# Patient Record
Sex: Female | Born: 1990 | Race: Black or African American | Hispanic: No | Marital: Single | Smoking: Former smoker
Health system: Southern US, Community
[De-identification: ages and names within clinical notes are randomized; demographics above are authoritative.]

## PROBLEM LIST (undated history)

## (undated) DIAGNOSIS — E042 Nontoxic multinodular goiter: Secondary | ICD-10-CM

## (undated) HISTORY — DX: Nontoxic multinodular goiter: E04.2

## (undated) HISTORY — PX: NO PAST SURGERIES: SHX2092

---

## 2016-01-24 ENCOUNTER — Encounter (HOSPITAL_COMMUNITY): Payer: Self-pay | Admitting: Emergency Medicine

## 2016-01-24 ENCOUNTER — Emergency Department (HOSPITAL_COMMUNITY)
Admission: EM | Admit: 2016-01-24 | Discharge: 2016-01-24 | Disposition: A | Discharge status: Home / Self Care | Payer: Medicaid Other | Attending: Physician Assistant | Admitting: Physician Assistant

## 2016-01-24 DIAGNOSIS — Z5321 Procedure and treatment not carried out due to patient leaving prior to being seen by health care provider: Secondary | ICD-10-CM | POA: Diagnosis not present

## 2016-01-24 DIAGNOSIS — M549 Dorsalgia, unspecified: Secondary | ICD-10-CM | POA: Diagnosis not present

## 2016-01-24 DIAGNOSIS — F17200 Nicotine dependence, unspecified, uncomplicated: Secondary | ICD-10-CM | POA: Insufficient documentation

## 2016-01-24 LAB — URINALYSIS, ROUTINE W REFLEX MICROSCOPIC
Bilirubin Urine: NEGATIVE
GLUCOSE, UA: NEGATIVE mg/dL
HGB URINE DIPSTICK: NEGATIVE
KETONES UR: NEGATIVE mg/dL
Leukocytes, UA: NEGATIVE
Nitrite: NEGATIVE
PROTEIN: NEGATIVE mg/dL
Specific Gravity, Urine: 1.021 (ref 1.005–1.030)
pH: 6.5 (ref 5.0–8.0)

## 2016-01-24 LAB — POC URINE PREG, ED: PREG TEST UR: NEGATIVE

## 2016-01-24 NOTE — ED Triage Notes (Signed)
Pt. reports upper/mid back pain onset today , denies injury / no urinary discomfort .

## 2016-01-24 NOTE — ED Notes (Signed)
Pt st's she has to leave to go pick up her child.

## 2019-03-08 ENCOUNTER — Ambulatory Visit (INDEPENDENT_AMBULATORY_CARE_PROVIDER_SITE_OTHER): Payer: Medicaid Other

## 2019-03-08 ENCOUNTER — Other Ambulatory Visit: Payer: Self-pay

## 2019-03-08 DIAGNOSIS — O0990 Supervision of high risk pregnancy, unspecified, unspecified trimester: Secondary | ICD-10-CM

## 2019-03-08 DIAGNOSIS — Z8759 Personal history of other complications of pregnancy, childbirth and the puerperium: Secondary | ICD-10-CM

## 2019-03-08 DIAGNOSIS — Z3480 Encounter for supervision of other normal pregnancy, unspecified trimester: Secondary | ICD-10-CM

## 2019-03-08 MED ORDER — BLOOD PRESSURE KIT DEVI: 1.0000 | 0 refills | Status: AC | PRN

## 2019-03-08 NOTE — Progress Notes (Addendum)
  Virtual Visit via Telephone Note  I connected with Nicole Day on 03/08/19 at  8:15 AM EDT by telephone and verified that I am speaking with the correct person using two identifiers.  Location: Patient: Nicole Day Provider: Hollice Gong, CMA    I discussed the limitations, risks, security and privacy concerns of performing an evaluation and management service by telephone and the availability of in person appointments. I also discussed with the patient that there may be a patient responsible charge related to this service. The patient expressed understanding and agreed to proceed.   History of Present Illness: PRENATAL INTAKE SUMMARY  Ms. Gundy presents today New OB Nurse Interview.  OB History    Gravida  2   Para  1   Term  1   Preterm      AB      Living  1     SAB      TAB      Ectopic      Multiple      Live Births  1          I have reviewed the patient's medical, obstetrical, social, and family histories, medications, and available lab results.  SUBJECTIVE: She has no unusual complaints   Observations/Objective: Initial nurse interview for history/labs (New OB)  EDD: 10/05/2019 GA: [redacted]w[redacted]d G2P1 FHT: not assessed, non face to face interview   GENERAL APPEARANCE: alert, oriented to person, place and time, non face to face interview   Assessment and Plan: High Risk pregnancy - Hx of IOL @37  weeks IUGR Prenatal care at [facility] labs and exam scheduled with provider Download Babyscripts Download Mychart  Follow Up Instructions:   I discussed the assessment and treatment plan with the patient. The patient was provided an opportunity to ask questions and all were answered. The patient agreed with the plan and demonstrated an understanding of the instructions.   The patient was advised to call back or seek an in-person evaluation if the symptoms worsen or if the condition fails to improve as anticipated.  I provided 20 minutes of  non-face-to-face time during this encounter.   Maryruth Eve, CMA   Attestation of Attending Supervision of CMA/RN: Evaluation and management procedures were performed by the nurse under my supervision and collaboration.  I have reviewed the nursing note and chart, and I agree with the management and plan.  Carolyn L. Harraway-Smith, M.D., Cherlynn June

## 2019-03-17 ENCOUNTER — Encounter: Payer: Self-pay | Admitting: Obstetrics and Gynecology

## 2019-03-17 ENCOUNTER — Other Ambulatory Visit (HOSPITAL_COMMUNITY)
Admission: RE | Admit: 2019-03-17 | Discharge: 2019-03-17 | Disposition: A | Discharge status: Home / Self Care | Payer: Medicaid Other | Source: Ambulatory Visit | Attending: Obstetrics and Gynecology | Admitting: Obstetrics and Gynecology

## 2019-03-17 ENCOUNTER — Other Ambulatory Visit: Payer: Self-pay

## 2019-03-17 ENCOUNTER — Ambulatory Visit (INDEPENDENT_AMBULATORY_CARE_PROVIDER_SITE_OTHER): Payer: Medicaid Other | Admitting: Obstetrics and Gynecology

## 2019-03-17 DIAGNOSIS — O0990 Supervision of high risk pregnancy, unspecified, unspecified trimester: Secondary | ICD-10-CM | POA: Diagnosis present

## 2019-03-17 DIAGNOSIS — Z8759 Personal history of other complications of pregnancy, childbirth and the puerperium: Secondary | ICD-10-CM

## 2019-03-17 DIAGNOSIS — Z3A11 11 weeks gestation of pregnancy: Secondary | ICD-10-CM

## 2019-03-17 DIAGNOSIS — O09291 Supervision of pregnancy with other poor reproductive or obstetric history, first trimester: Secondary | ICD-10-CM

## 2019-03-17 DIAGNOSIS — O0991 Supervision of high risk pregnancy, unspecified, first trimester: Secondary | ICD-10-CM | POA: Diagnosis not present

## 2019-03-17 NOTE — Progress Notes (Signed)
Pt presents for NOB exam. NOB intake completed virtually. Flu vaccine offered; pt declined.

## 2019-03-17 NOTE — Patient Instructions (Signed)
° °First Trimester of Pregnancy °The first trimester of pregnancy is from week 1 until the end of week 13 (months 1 through 3). A week after a sperm fertilizes an egg, the egg will implant on the wall of the uterus. This embryo will begin to develop into a baby. Genes from you and your partner will form the baby. The female genes will determine whether the baby will be a boy or a girl. At 6-8 weeks, the eyes and face will be formed, and the heartbeat can be seen on ultrasound. At the end of 12 weeks, all the baby's organs will be formed. °Now that you are pregnant, you will want to do everything you can to have a healthy baby. Two of the most important things are to get good prenatal care and to follow your health care provider's instructions. Prenatal care is all the medical care you receive before the baby's birth. This care will help prevent, find, and treat any problems during the pregnancy and childbirth. °Body changes during your first trimester °Your body goes through many changes during pregnancy. The changes vary from woman to woman. °· You may gain or lose a couple of pounds at first. °· You may feel sick to your stomach (nauseous) and you may throw up (vomit). If the vomiting is uncontrollable, call your health care provider. °· You may tire easily. °· You may develop headaches that can be relieved by medicines. All medicines should be approved by your health care provider. °· You may urinate more often. Painful urination may mean you have a bladder infection. °· You may develop heartburn as a result of your pregnancy. °· You may develop constipation because certain hormones are causing the muscles that push stool through your intestines to slow down. °· You may develop hemorrhoids or swollen veins (varicose veins). °· Your breasts may begin to grow larger and become tender. Your nipples may stick out more, and the tissue that surrounds them (areola) may become darker. °· Your gums may bleed and may be  sensitive to brushing and flossing. °· Dark spots or blotches (chloasma, mask of pregnancy) may develop on your face. This will likely fade after the baby is born. °· Your menstrual periods will stop. °· You may have a loss of appetite. °· You may develop cravings for certain kinds of food. °· You may have changes in your emotions from day to day, such as being excited to be pregnant or being concerned that something may go wrong with the pregnancy and baby. °· You may have more vivid and strange dreams. °· You may have changes in your hair. These can include thickening of your hair, rapid growth, and changes in texture. Some women also have hair loss during or after pregnancy, or hair that feels dry or thin. Your hair will most likely return to normal after your baby is born. °What to expect at prenatal visits °During a routine prenatal visit: °· You will be weighed to make sure you and the baby are growing normally. °· Your blood pressure will be taken. °· Your abdomen will be measured to track your baby's growth. °· The fetal heartbeat will be listened to between weeks 10 and 14 of your pregnancy. °· Test results from any previous visits will be discussed. °Your health care provider may ask you: °· How you are feeling. °· If you are feeling the baby move. °· If you have had any abnormal symptoms, such as leaking fluid, bleeding, severe headaches, or   abdominal cramping. °· If you are using any tobacco products, including cigarettes, chewing tobacco, and electronic cigarettes. °· If you have any questions. °Other tests that may be performed during your first trimester include: °· Blood tests to find your blood type and to check for the presence of any previous infections. The tests will also be used to check for low iron levels (anemia) and protein on red blood cells (Rh antibodies). Depending on your risk factors, or if you previously had diabetes during pregnancy, you may have tests to check for high blood sugar  that affects pregnant women (gestational diabetes). °· Urine tests to check for infections, diabetes, or protein in the urine. °· An ultrasound to confirm the proper growth and development of the baby. °· Fetal screens for spinal cord problems (spina bifida) and Down syndrome. °· HIV (human immunodeficiency virus) testing. Routine prenatal testing includes screening for HIV, unless you choose not to have this test. °· You may need other tests to make sure you and the baby are doing well. °Follow these instructions at home: °Medicines °· Follow your health care provider's instructions regarding medicine use. Specific medicines may be either safe or unsafe to take during pregnancy. °· Take a prenatal vitamin that contains at least 600 micrograms (mcg) of folic acid. °· If you develop constipation, try taking a stool softener if your health care provider approves. °Eating and drinking ° °· Eat a balanced diet that includes fresh fruits and vegetables, whole grains, good sources of protein such as meat, eggs, or tofu, and low-fat dairy. Your health care provider will help you determine the amount of weight gain that is right for you. °· Avoid raw meat and uncooked cheese. These carry germs that can cause birth defects in the baby. °· Eating four or five small meals rather than three large meals a day may help relieve nausea and vomiting. If you start to feel nauseous, eating a few soda crackers can be helpful. Drinking liquids between meals, instead of during meals, also seems to help ease nausea and vomiting. °· Limit foods that are high in fat and processed sugars, such as fried and sweet foods. °· To prevent constipation: °? Eat foods that are high in fiber, such as fresh fruits and vegetables, whole grains, and beans. °? Drink enough fluid to keep your urine clear or pale yellow. °Activity °· Exercise only as directed by your health care provider. Most women can continue their usual exercise routine during  pregnancy. Try to exercise for 30 minutes at least 5 days a week. Exercising will help you: °? Control your weight. °? Stay in shape. °? Be prepared for labor and delivery. °· Experiencing pain or cramping in the lower abdomen or lower back is a good sign that you should stop exercising. Check with your health care provider before continuing with normal exercises. °· Try to avoid standing for long periods of time. Move your legs often if you must stand in one place for a long time. °· Avoid heavy lifting. °· Wear low-heeled shoes and practice good posture. °· You may continue to have sex unless your health care provider tells you not to. °Relieving pain and discomfort °· Wear a good support bra to relieve breast tenderness. °· Take warm sitz baths to soothe any pain or discomfort caused by hemorrhoids. Use hemorrhoid cream if your health care provider approves. °· Rest with your legs elevated if you have leg cramps or low back pain. °· If you develop varicose veins   in your legs, wear support hose. Elevate your feet for 15 minutes, 3-4 times a day. Limit salt in your diet. °Prenatal care °· Schedule your prenatal visits by the twelfth week of pregnancy. They are usually scheduled monthly at first, then more often in the last 2 months before delivery. °· Write down your questions. Take them to your prenatal visits. °· Keep all your prenatal visits as told by your health care provider. This is important. °Safety °· Wear your seat belt at all times when driving. °· Make a list of emergency phone numbers, including numbers for family, friends, the hospital, and police and fire departments. °General instructions °· Ask your health care provider for a referral to a local prenatal education class. Begin classes no later than the beginning of month 6 of your pregnancy. °· Ask for help if you have counseling or nutritional needs during pregnancy. Your health care provider can offer advice or refer you to specialists for help  with various needs. °· Do not use hot tubs, steam rooms, or saunas. °· Do not douche or use tampons or scented sanitary pads. °· Do not cross your legs for long periods of time. °· Avoid cat litter boxes and soil used by cats. These carry germs that can cause birth defects in the baby and possibly loss of the fetus by miscarriage or stillbirth. °· Avoid all smoking, herbs, alcohol, and medicines not prescribed by your health care provider. Chemicals in these products affect the formation and growth of the baby. °· Do not use any products that contain nicotine or tobacco, such as cigarettes and e-cigarettes. If you need help quitting, ask your health care provider. You may receive counseling support and other resources to help you quit. °· Schedule a dentist appointment. At home, brush your teeth with a soft toothbrush and be gentle when you floss. °Contact a health care provider if: °· You have dizziness. °· You have mild pelvic cramps, pelvic pressure, or nagging pain in the abdominal area. °· You have persistent nausea, vomiting, or diarrhea. °· You have a bad smelling vaginal discharge. °· You have pain when you urinate. °· You notice increased swelling in your face, hands, legs, or ankles. °· You are exposed to fifth disease or chickenpox. °· You are exposed to German measles (rubella) and have never had it. °Get help right away if: °· You have a fever. °· You are leaking fluid from your vagina. °· You have spotting or bleeding from your vagina. °· You have severe abdominal cramping or pain. °· You have rapid weight gain or loss. °· You vomit blood or material that looks like coffee grounds. °· You develop a severe headache. °· You have shortness of breath. °· You have any kind of trauma, such as from a fall or a car accident. °Summary °· The first trimester of pregnancy is from week 1 until the end of week 13 (months 1 through 3). °· Your body goes through many changes during pregnancy. The changes vary from  woman to woman. °· You will have routine prenatal visits. During those visits, your health care provider will examine you, discuss any test results you may have, and talk with you about how you are feeling. °This information is not intended to replace advice given to you by your health care provider. Make sure you discuss any questions you have with your health care provider. °Document Released: 05/27/2001 Document Revised: 05/15/2017 Document Reviewed: 05/14/2016 °[employer] Patient Education © 2020 [employer] ° ° °  Second Trimester of Pregnancy °The second trimester is from week 14 through week 27 (months 4 through 6). The second trimester is often a time when you feel your best. Your body has adjusted to being pregnant, and you begin to feel better physically. Usually, morning sickness has lessened or quit completely, you may have more energy, and you may have an increase in appetite. The second trimester is also a time when the fetus is growing rapidly. At the end of the sixth month, the fetus is about 9 inches long and weighs about 1½ pounds. You will likely begin to feel the baby move (quickening) between 16 and 20 weeks of pregnancy. °Body changes during your second trimester °Your body continues to go through many changes during your second trimester. The changes vary from woman to woman. °· Your weight will continue to increase. You will notice your lower abdomen bulging out. °· You may begin to get stretch marks on your hips, abdomen, and breasts. °· You may develop headaches that can be relieved by medicines. The medicines should be approved by your health care provider. °· You may urinate more often because the fetus is pressing on your bladder. °· You may develop or continue to have heartburn as a result of your pregnancy. °· You may develop constipation because certain hormones are causing the muscles that push waste through your intestines to slow down. °· You may develop hemorrhoids or swollen,  bulging veins (varicose veins). °· You may have back pain. This is caused by: °? Weight gain. °? Pregnancy hormones that are relaxing the joints in your pelvis. °? A shift in weight and the muscles that support your balance. °· Your breasts will continue to grow and they will continue to become tender. °· Your gums may bleed and may be sensitive to brushing and flossing. °· Dark spots or blotches (chloasma, mask of pregnancy) may develop on your face. This will likely fade after the baby is born. °· A dark line from your belly button to the pubic area (linea nigra) may appear. This will likely fade after the baby is born. °· You may have changes in your hair. These can include thickening of your hair, rapid growth, and changes in texture. Some women also have hair loss during or after pregnancy, or hair that feels dry or thin. Your hair will most likely return to normal after your baby is born. °What to expect at prenatal visits °During a routine prenatal visit: °· You will be weighed to make sure you and the fetus are growing normally. °· Your blood pressure will be taken. °· Your abdomen will be measured to track your baby's growth. °· The fetal heartbeat will be listened to. °· Any test results from the previous visit will be discussed. °Your health care provider may ask you: °· How you are feeling. °· If you are feeling the baby move. °· If you have had any abnormal symptoms, such as leaking fluid, bleeding, severe headaches, or abdominal cramping. °· If you are using any tobacco products, including cigarettes, chewing tobacco, and electronic cigarettes. °· If you have any questions. °Other tests that may be performed during your second trimester include: °· Blood tests that check for: °? Low iron levels (anemia). °? High blood sugar that affects pregnant women (gestational diabetes) between 24 and 28 weeks. °? Rh antibodies. This is to check for a protein on red blood cells (Rh factor). °· Urine tests to check  for infections, diabetes, or protein in   the urine. °· An ultrasound to confirm the proper growth and development of the baby. °· An amniocentesis to check for possible genetic problems. °· Fetal screens for spina bifida and Down syndrome. °· HIV (human immunodeficiency virus) testing. Routine prenatal testing includes screening for HIV, unless you choose not to have this test. °Follow these instructions at home: °Medicines °· Follow your health care provider's instructions regarding medicine use. Specific medicines may be either safe or unsafe to take during pregnancy. °· Take a prenatal vitamin that contains at least 600 micrograms (mcg) of folic acid. °· If you develop constipation, try taking a stool softener if your health care provider approves. °Eating and drinking ° °· Eat a balanced diet that includes fresh fruits and vegetables, whole grains, good sources of protein such as meat, eggs, or tofu, and low-fat dairy. Your health care provider will help you determine the amount of weight gain that is right for you. °· Avoid raw meat and uncooked cheese. These carry germs that can cause birth defects in the baby. °· If you have low calcium intake from food, talk to your health care provider about whether you should take a daily calcium supplement. °· Limit foods that are high in fat and processed sugars, such as fried and sweet foods. °· To prevent constipation: °? Drink enough fluid to keep your urine clear or pale yellow. °? Eat foods that are high in fiber, such as fresh fruits and vegetables, whole grains, and beans. °Activity °· Exercise only as directed by your health care provider. Most women can continue their usual exercise routine during pregnancy. Try to exercise for 30 minutes at least 5 days a week. Stop exercising if you experience uterine contractions. °· Avoid heavy lifting, wear low heel shoes, and practice good posture. °· A sexual relationship may be continued unless your health care provider  directs you otherwise. °Relieving pain and discomfort °· Wear a good support bra to prevent discomfort from breast tenderness. °· Take warm sitz baths to soothe any pain or discomfort caused by hemorrhoids. Use hemorrhoid cream if your health care provider approves. °· Rest with your legs elevated if you have leg cramps or low back pain. °· If you develop varicose veins, wear support hose. Elevate your feet for 15 minutes, 3-4 times a day. Limit salt in your diet. °Prenatal Care °· Write down your questions. Take them to your prenatal visits. °· Keep all your prenatal visits as told by your health care provider. This is important. °Safety °· Wear your seat belt at all times when driving. °· Make a list of emergency phone numbers, including numbers for family, friends, the hospital, and police and fire departments. °General instructions °· Ask your health care provider for a referral to a local prenatal education class. Begin classes no later than the beginning of month 6 of your pregnancy. °· Ask for help if you have counseling or nutritional needs during pregnancy. Your health care provider can offer advice or refer you to specialists for help with various needs. °· Do not use hot tubs, steam rooms, or saunas. °· Do not douche or use tampons or scented sanitary pads. °· Do not cross your legs for long periods of time. °· Avoid cat litter boxes and soil used by cats. These carry germs that can cause birth defects in the baby and possibly loss of the fetus by miscarriage or stillbirth. °· Avoid all smoking, herbs, alcohol, and unprescribed drugs. Chemicals in these products can affect the formation   and growth of the baby. °· Do not use any products that contain nicotine or tobacco, such as cigarettes and e-cigarettes. If you need help quitting, ask your health care provider. °· Visit your dentist if you have not gone yet during your pregnancy. Use a soft toothbrush to brush your teeth and be gentle when you  floss. °Contact a health care provider if: °· You have dizziness. °· You have mild pelvic cramps, pelvic pressure, or nagging pain in the abdominal area. °· You have persistent nausea, vomiting, or diarrhea. °· You have a bad smelling vaginal discharge. °· You have pain when you urinate. °Get help right away if: °· You have a fever. °· You are leaking fluid from your vagina. °· You have spotting or bleeding from your vagina. °· You have severe abdominal cramping or pain. °· You have rapid weight gain or weight loss. °· You have shortness of breath with chest pain. °· You notice sudden or extreme swelling of your face, hands, ankles, feet, or legs. °· You have not felt your baby move in over an hour. °· You have severe headaches that do not go away when you take medicine. °· You have vision changes. °Summary °· The second trimester is from week 14 through week 27 (months 4 through 6). It is also a time when the fetus is growing rapidly. °· Your body goes through many changes during pregnancy. The changes vary from woman to woman. °· Avoid all smoking, herbs, alcohol, and unprescribed drugs. These chemicals affect the formation and growth your baby. °· Do not use any tobacco products, such as cigarettes, chewing tobacco, and e-cigarettes. If you need help quitting, ask your health care provider. °· Contact your health care provider if you have any questions. Keep all prenatal visits as told by your health care provider. This is important. °This information is not intended to replace advice given to you by your health care provider. Make sure you discuss any questions you have with your health care provider. °Document Released: 05/27/2001 Document Revised: 09/24/2018 Document Reviewed: 07/08/2016 °[employer] Patient Education © 2020 [employer] ° ° °Contraception Choices °Contraception, also called birth control, refers to methods or devices that prevent pregnancy. °Hormonal methods °Contraceptive implant ° °A  contraceptive implant is a thin, plastic tube that contains a hormone. It is inserted into the upper part of the arm. It can remain in place for up to 3 years. °Progestin-only injections °Progestin-only injections are injections of progestin, a synthetic form of the hormone progesterone. They are given every 3 months by a health care provider. °Birth control pills ° °Birth control pills are pills that contain hormones that prevent pregnancy. They must be taken once a day, preferably at the same time each day. °Birth control patch ° °The birth control patch contains hormones that prevent pregnancy. It is placed on the skin and must be changed once a week for three weeks and removed on the fourth week. A prescription is needed to use this method of contraception. °Vaginal ring ° °A vaginal ring contains hormones that prevent pregnancy. It is placed in the vagina for three weeks and removed on the fourth week. After that, the process is repeated with a new ring. A prescription is needed to use this method of contraception. °Emergency contraceptive °Emergency contraceptives prevent pregnancy after unprotected sex. They come in pill form and can be taken up to 5 days after sex. They work best the sooner they are taken after having sex. Most emergency contraceptives   are available without a prescription. This method should not be used as your only form of birth control. °Barrier methods °Female condom ° °A female condom is a thin sheath that is worn over the penis during sex. Condoms keep sperm from going inside a woman's body. They can be used with a spermicide to increase their effectiveness. They should be disposed after a single use. °Female condom ° °A female condom is a soft, loose-fitting sheath that is put into the vagina before sex. The condom keeps sperm from going inside a woman's body. They should be disposed after a single use. °Diaphragm ° °A diaphragm is a soft, dome-shaped barrier. It is inserted into the  vagina before sex, along with a spermicide. The diaphragm blocks sperm from entering the uterus, and the spermicide kills sperm. A diaphragm should be left in the vagina for 6-8 hours after sex and removed within 24 hours. °A diaphragm is prescribed and fitted by a health care provider. A diaphragm should be replaced every 1-2 years, after giving birth, after gaining more than 15 lb (6.8 kg), and after pelvic surgery. °Cervical cap ° °A cervical cap is a round, soft latex or plastic cup that fits over the cervix. It is inserted into the vagina before sex, along with spermicide. It blocks sperm from entering the uterus. The cap should be left in place for 6-8 hours after sex and removed within 48 hours. A cervical cap must be prescribed and fitted by a health care provider. It should be replaced every 2 years. °Sponge ° °A sponge is a soft, circular piece of polyurethane foam with spermicide on it. The sponge helps block sperm from entering the uterus, and the spermicide kills sperm. To use it, you make it wet and then insert it into the vagina. It should be inserted before sex, left in for at least 6 hours after sex, and removed and thrown away within 30 hours. °Spermicides °Spermicides are chemicals that kill or block sperm from entering the cervix and uterus. They can come as a cream, jelly, suppository, foam, or tablet. A spermicide should be inserted into the vagina with an applicator at least 10-15 minutes before sex to allow time for it to work. The process must be repeated every time you have sex. Spermicides do not require a prescription. °Intrauterine contraception °Intrauterine device (IUD) °An IUD is a T-shaped device that is put in a woman's uterus. There are two types: °· Hormone IUD.This type contains progestin, a synthetic form of the hormone progesterone. This type can stay in place for 3-5 years. °· Copper IUD.This type is wrapped in copper wire. It can stay in place for 10 years. ° °Permanent  methods of contraception °Female tubal ligation °In this method, a woman's fallopian tubes are sealed, tied, or blocked during surgery to prevent eggs from traveling to the uterus. °Hysteroscopic sterilization °In this method, a small, flexible insert is placed into each fallopian tube. The inserts cause scar tissue to form in the fallopian tubes and block them, so sperm cannot reach an egg. The procedure takes about 3 months to be effective. Another form of birth control must be used during those 3 months. °Female sterilization °This is a procedure to tie off the tubes that carry sperm (vasectomy). After the procedure, the man can still ejaculate fluid (semen). °Natural planning methods °Natural family planning °In this method, a couple does not have sex on days when the woman could become pregnant. °Calendar method °This means keeping track of   the length of each menstrual cycle, identifying the days when pregnancy can happen, and not having sex on those days. °Ovulation method °In this method, a couple avoids sex during ovulation. °Symptothermal method °This method involves not having sex during ovulation. The woman typically checks for ovulation by watching changes in her temperature and in the consistency of cervical mucus. °Post-ovulation method °In this method, a couple waits to have sex until after ovulation. °Summary °· Contraception, also called birth control, means methods or devices that prevent pregnancy. °· Hormonal methods of contraception include implants, injections, pills, patches, vaginal rings, and emergency contraceptives. °· Barrier methods of contraception can include female condoms, female condoms, diaphragms, cervical caps, sponges, and spermicides. °· There are two types of IUDs (intrauterine devices). An IUD can be put in a woman's uterus to prevent pregnancy for 3-5 years. °· Permanent sterilization can be done through a procedure for males, females, or both. °· Natural family planning methods  involve not having sex on days when the woman could become pregnant. °This information is not intended to replace advice given to you by your health care provider. Make sure you discuss any questions you have with your health care provider. °Document Released: 06/02/2005 Document Revised: 06/04/2017 Document Reviewed: 07/05/2016 °Elsevier Patient Education © 2020 [employer] ° ° °Breastfeeding ° °Choosing to breastfeed is one of the best decisions you can make for yourself and your baby. A change in hormones during pregnancy causes your breasts to make breast milk in your milk-producing glands. Hormones prevent breast milk from being released before your baby is born. They also prompt milk flow after birth. Once breastfeeding has begun, thoughts of your baby, as well as his or her sucking or crying, can stimulate the release of milk from your milk-producing glands. °Benefits of breastfeeding °Research shows that breastfeeding offers many health benefits for infants and mothers. It also offers a cost-free and convenient way to feed your baby. °For your baby °· Your first milk (colostrum) helps your baby's digestive system to function better. °· Special cells in your milk (antibodies) help your baby to fight off infections. °· Breastfed babies are less likely to develop asthma, allergies, obesity, or type 2 diabetes. They are also at lower risk for sudden infant death syndrome (SIDS). °· Nutrients in breast milk are better able to meet your baby’s needs compared to infant formula. °· Breast milk improves your baby's brain development. °For you °· Breastfeeding helps to create a very special bond between you and your baby. °· Breastfeeding is convenient. Breast milk costs nothing and is always available at the correct temperature. °· Breastfeeding helps to burn calories. It helps you to lose the weight that you gained during pregnancy. °· Breastfeeding makes your uterus return faster to its size before pregnancy. It  also slows bleeding (lochia) after you give birth. °· Breastfeeding helps to lower your risk of developing type 2 diabetes, osteoporosis, rheumatoid arthritis, cardiovascular disease, and breast, ovarian, uterine, and endometrial cancer later in life. °Breastfeeding basics °Starting breastfeeding °· Find a comfortable place to sit or lie down, with your neck and back well-supported. °· Place a pillow or a rolled-up blanket under your baby to bring him or her to the level of your breast (if you are seated). Nursing pillows are specially designed to help support your arms and your baby while you breastfeed. °· Make sure that your baby's tummy (abdomen) is facing your abdomen. °· Gently massage your breast. With your fingertips, massage from the outer edges   of your breast inward toward the nipple. This encourages milk flow. If your milk flows slowly, you may need to continue this action during the feeding. °· Support your breast with 4 fingers underneath and your thumb above your nipple (make the letter "C" with your hand). Make sure your fingers are well away from your nipple and your baby’s mouth. °· Stroke your baby's lips gently with your finger or nipple. °· When your baby's mouth is open wide enough, quickly bring your baby to your breast, placing your entire nipple and as much of the areola as possible into your baby's mouth. The areola is the colored area around your nipple. °? More areola should be visible above your baby's upper lip than below the lower lip. °? Your baby's lips should be opened and extended outward (flanged) to ensure an adequate, comfortable latch. °? Your baby's tongue should be between his or her lower gum and your breast. °· Make sure that your baby's mouth is correctly positioned around your nipple (latched). Your baby's lips should create a seal on your breast and be turned out (everted). °· It is common for your baby to suck about 2-3 minutes in order to start the flow of breast  milk. °Latching °Teaching your baby how to latch onto your breast properly is very important. An improper latch can cause nipple pain, decreased milk supply, and poor weight gain in your baby. Also, if your baby is not latched onto your nipple properly, he or she may swallow some air during feeding. This can make your baby fussy. Burping your baby when you switch breasts during the feeding can help to get rid of the air. However, teaching your baby to latch on properly is still the best way to prevent fussiness from swallowing air while breastfeeding. °Signs that your baby has successfully latched onto your nipple °· Silent tugging or silent sucking, without causing you pain. Infant's lips should be extended outward (flanged). °· Swallowing heard between every 3-4 sucks once your milk has started to flow (after your let-down milk reflex occurs). °· Muscle movement above and in front of his or her ears while sucking. °Signs that your baby has not successfully latched onto your nipple °· Sucking sounds or smacking sounds from your baby while breastfeeding. °· Nipple pain. °If you think your baby has not latched on correctly, slip your finger into the corner of your baby’s mouth to break the suction and place it between your baby's gums. Attempt to start breastfeeding again. °Signs of successful breastfeeding °Signs from your baby °· Your baby will gradually decrease the number of sucks or will completely stop sucking. °· Your baby will fall asleep. °· Your baby's body will relax. °· Your baby will retain a small amount of milk in his or her mouth. °· Your baby will let go of your breast by himself or herself. °Signs from you °· Breasts that have increased in firmness, weight, and size 1-3 hours after feeding. °· Breasts that are softer immediately after breastfeeding. °· Increased milk volume, as well as a change in milk consistency and color by the fifth day of breastfeeding. °· Nipples that are not sore, cracked, or  bleeding. °Signs that your baby is getting enough milk °· Wetting at least 1-2 diapers during the first 24 hours after birth. °· Wetting at least 5-6 diapers every 24 hours for the first week after birth. The urine should be clear or pale yellow by the age of 5 days. °· Wetting   6-8 diapers every 24 hours as your baby continues to grow and develop. °· At least 3 stools in a 24-hour period by the age of 5 days. The stool should be soft and yellow. °· At least 3 stools in a 24-hour period by the age of 7 days. The stool should be seedy and yellow. °· No loss of weight greater than 10% of birth weight during the first 3 days of life. °· Average weight gain of 4-7 oz (113-198 g) per week after the age of 4 days. °· Consistent daily weight gain by the age of 5 days, without weight loss after the age of 2 weeks. °After a feeding, your baby may spit up a small amount of milk. This is normal. °Breastfeeding frequency and duration °Frequent feeding will help you make more milk and can prevent sore nipples and extremely full breasts (breast engorgement). Breastfeed when you feel the need to reduce the fullness of your breasts or when your baby shows signs of hunger. This is called "breastfeeding on demand." Signs that your baby is hungry include: °· Increased alertness, activity, or restlessness. °· Movement of the head from side to side. °· Opening of the mouth when the corner of the mouth or cheek is stroked (rooting). °· Increased sucking sounds, smacking lips, cooing, sighing, or squeaking. °· Hand-to-mouth movements and sucking on fingers or hands. °· Fussing or crying. °Avoid introducing a pacifier to your baby in the first 4-6 weeks after your baby is born. After this time, you may choose to use a pacifier. Research has shown that pacifier use during the first year of a baby's life decreases the risk of sudden infant death syndrome (SIDS). °Allow your baby to feed on each breast as long as he or she wants. When your  baby unlatches or falls asleep while feeding from the first breast, offer the second breast. Because newborns are often sleepy in the first few weeks of life, you may need to awaken your baby to get him or her to feed. °Breastfeeding times will vary from baby to baby. However, the following rules can serve as a guide to help you make sure that your baby is properly fed: °· Newborns (babies 4 weeks of age or younger) may breastfeed every 1-3 hours. °· Newborns should not go without breastfeeding for longer than 3 hours during the day or 5 hours during the night. °· You should breastfeed your baby a minimum of 8 times in a 24-hour period. °Breast milk pumping ° °  ° °Pumping and storing breast milk allows you to make sure that your baby is exclusively fed your breast milk, even at times when you are unable to breastfeed. This is especially important if you go back to work while you are still breastfeeding, or if you are not able to be present during feedings. Your lactation consultant can help you find a method of pumping that works best for you and give you guidelines about how long it is safe to store breast milk. °Caring for your breasts while you breastfeed °Nipples can become dry, cracked, and sore while breastfeeding. The following recommendations can help keep your breasts moisturized and healthy: °· Avoid using soap on your nipples. °· Wear a supportive bra designed especially for nursing. Avoid wearing underwire-style bras or extremely tight bras (sports bras). °· Air-dry your nipples for 3-4 minutes after each feeding. °· Use only cotton bra pads to absorb leaked breast milk. Leaking of breast milk between feedings is normal. °·   Use lanolin on your nipples after breastfeeding. Lanolin helps to maintain your skin's normal moisture barrier. Pure lanolin is not harmful (not toxic) to your baby. You may also hand express a few drops of breast milk and gently massage that milk into your nipples and allow the milk  to air-dry. °In the first few weeks after giving birth, some women experience breast engorgement. Engorgement can make your breasts feel heavy, warm, and tender to the touch. Engorgement peaks within 3-5 days after you give birth. The following recommendations can help to ease engorgement: °· Completely empty your breasts while breastfeeding or pumping. You may want to start by applying warm, moist heat (in the shower or with warm, water-soaked hand towels) just before feeding or pumping. This increases circulation and helps the milk flow. If your baby does not completely empty your breasts while breastfeeding, pump any extra milk after he or she is finished. °· Apply ice packs to your breasts immediately after breastfeeding or pumping, unless this is too uncomfortable for you. To do this: °? Put ice in a plastic bag. °? Place a towel between your skin and the bag. °? Leave the ice on for 20 minutes, 2-3 times a day. °· Make sure that your baby is latched on and positioned properly while breastfeeding. °If engorgement persists after 48 hours of following these recommendations, contact your health care provider or a lactation consultant. °Overall health care recommendations while breastfeeding °· Eat 3 healthy meals and 3 snacks every day. Well-nourished mothers who are breastfeeding need an additional 450-500 calories a day. You can meet this requirement by increasing the amount of a balanced diet that you eat. °· Drink enough water to keep your urine pale yellow or clear. °· Rest often, relax, and continue to take your prenatal vitamins to prevent fatigue, stress, and low vitamin and mineral levels in your body (nutrient deficiencies). °· Do not use any products that contain nicotine or tobacco, such as cigarettes and e-cigarettes. Your baby may be harmed by chemicals from cigarettes that pass into breast milk and exposure to secondhand smoke. If you need help quitting, ask your health care provider. °· Avoid  alcohol. °· Do not use illegal drugs or marijuana. °· Talk with your health care provider before taking any medicines. These include over-the-counter and prescription medicines as well as vitamins and herbal supplements. Some medicines that may be harmful to your baby can pass through breast milk. °· It is possible to become pregnant while breastfeeding. If birth control is desired, ask your health care provider about options that will be safe while breastfeeding your baby. °Where to find more information: °[employer]: www.llli.org °Contact a health care provider if: °· You feel like you want to stop breastfeeding or have become frustrated with breastfeeding. °· Your nipples are cracked or bleeding. °· Your breasts are red, tender, or warm. °· You have: °? Painful breasts or nipples. °? A swollen area on either breast. °? A fever or chills. °? Nausea or vomiting. °? Drainage other than breast milk from your nipples. °· Your breasts do not become full before feedings by the fifth day after you give birth. °· You feel sad and depressed. °· Your baby is: °? Too sleepy to eat well. °? Having trouble sleeping. °? More than 1 week old and wetting fewer than 6 diapers in a 24-hour period. °? Not gaining weight by 5 days of age. °· Your baby has fewer than 3 stools in a 24-hour period. °·   Your baby's skin or the white parts of his or her eyes become yellow. °Get help right away if: °· Your baby is overly tired (lethargic) and does not want to wake up and feed. °· Your baby develops an unexplained fever. °Summary °· Breastfeeding offers many health benefits for infant and mothers. °· Try to breastfeed your infant when he or she shows early signs of hunger. °· Gently tickle or stroke your baby's lips with your finger or nipple to allow the baby to open his or her mouth. Bring the baby to your breast. Make sure that much of the areola is in your baby's mouth. Offer one side and burp the baby before you offer  the other side. °· Talk with your health care provider or lactation consultant if you have questions or you face problems as you breastfeed. °This information is not intended to replace advice given to you by your health care provider. Make sure you discuss any questions you have with your health care provider. °Document Released: 06/02/2005 Document Revised: 08/27/2017 Document Reviewed: 07/04/2016 °Elsevier Patient Education © 2020 [employer]. ° °

## 2019-03-17 NOTE — Progress Notes (Signed)
  Subjective:    Nicole Day is a G2P1001 [redacted]w[redacted]d being seen today for her first obstetrical visit.  Her obstetrical history is significant for previous pregnancy complicated by IUGR requiring IOL at term. Patient with known thyromegaly and failed to follow up with endocrinologist. Patient does intend to breast feed. Pregnancy history fully reviewed.  Patient reports no complaints.  Vitals:   03/17/19 1046  BP: 117/79  Pulse: 80  Temp: 98.8 F (37.1 C)  Weight: 112 lb (50.8 kg)    HISTORY: OB History  Gravida Para Term Preterm AB Living  2 1 1     1   SAB TAB Ectopic Multiple Live Births          1    # Outcome Date GA Lbr Len/2nd Weight Sex Delivery Anes PTL Lv  2 Current           1 Term 05/16/12 [redacted]w[redacted]d  4 lb 11 oz (2.126 kg) M Vag-Spont EPI  LIV   Past Medical History:  Diagnosis Date  . Multiple thyroid nodules    Records from [facility]   History reviewed. No pertinent surgical history. Family History  Problem Relation Age of Onset  . Hypertension Mother      Exam    Uterus:   12-weeks  Pelvic Exam:    Perineum: No Hemorrhoids   Vulva: normal   Vagina:  normal mucosa, normal discharge   pH:    Cervix: multiparous appearance and cervic is closed and long   Adnexa: normal adnexa and no mass, fullness, tenderness   Bony Pelvis: gynecoid  System: Breast:  normal appearance, no masses or tenderness   Skin: normal coloration and turgor, no rashes    Neurologic: oriented, no focal deficits   Extremities: normal strength, tone, and muscle mass   HEENT extra ocular movement intact   Mouth/Teeth mucous membranes moist, pharynx normal without lesions and dental hygiene good   Neck supple and palpable enlarged thyroid   Cardiovascular: regular rate and rhythm   Respiratory:  appears well, vitals normal, no respiratory distress, acyanotic, normal RR   Abdomen: soft, non-tender; bowel sounds normal; no masses,  no organomegaly   Urinary:       Assessment:    Pregnancy: G2P1001 Patient Active Problem List   Diagnosis Date Noted  . Supervision of high risk pregnancy, antepartum 03/08/2019  . History of prior pregnancy with IUGR newborn 03/08/2019        Plan:     Initial labs drawn. Prenatal vitamins. Problem list reviewed and updated. Genetic Screening discussed : Panorama ordered .  Ultrasound discussed; fetal survey: ordered.  Follow up in 4 weeks. Patient declined flu vaccine 50% of 30 min visit spent on counseling and coordination of care.     Vernal Hritz 03/17/2019

## 2019-03-18 LAB — OBSTETRIC PANEL, INCLUDING HIV
Antibody Screen: NEGATIVE
Basophils Absolute: 0 10*3/uL (ref 0.0–0.2)
Basos: 0 %
EOS (ABSOLUTE): 0 10*3/uL (ref 0.0–0.4)
Eos: 1 %
HIV Screen 4th Generation wRfx: NONREACTIVE
Hematocrit: 33 % — ABNORMAL LOW (ref 34.0–46.6)
Hemoglobin: 11.5 g/dL (ref 11.1–15.9)
Hepatitis B Surface Ag: NEGATIVE
Immature Grans (Abs): 0 10*3/uL (ref 0.0–0.1)
Immature Granulocytes: 0 %
Lymphocytes Absolute: 1.1 10*3/uL (ref 0.7–3.1)
Lymphs: 19 %
MCH: 31.4 pg (ref 26.6–33.0)
MCHC: 34.8 g/dL (ref 31.5–35.7)
MCV: 90 fL (ref 79–97)
Monocytes Absolute: 0.5 10*3/uL (ref 0.1–0.9)
Monocytes: 9 %
Neutrophils Absolute: 3.9 10*3/uL (ref 1.4–7.0)
Neutrophils: 71 %
Platelets: 226 10*3/uL (ref 150–450)
RBC: 3.66 x10E6/uL — ABNORMAL LOW (ref 3.77–5.28)
RDW: 12.5 % (ref 11.7–15.4)
RPR Ser Ql: NONREACTIVE
Rh Factor: POSITIVE
Rubella Antibodies, IGG: 5.05 index (ref >0.99)
WBC: 5.4 10*3/uL (ref 3.4–10.8)

## 2019-03-18 LAB — TSH: TSH: 1.03 u[IU]/mL (ref 0.450–4.500)

## 2019-03-18 LAB — CERVICOVAGINAL ANCILLARY ONLY
Chlamydia: NEGATIVE
Neisseria Gonorrhea: NEGATIVE

## 2019-03-19 LAB — URINE CULTURE, OB REFLEX

## 2019-03-19 LAB — CULTURE, OB URINE

## 2019-03-23 ENCOUNTER — Encounter: Payer: Self-pay | Admitting: Obstetrics and Gynecology

## 2019-03-23 DIAGNOSIS — E010 Iodine-deficiency related diffuse (endemic) goiter: Secondary | ICD-10-CM | POA: Insufficient documentation

## 2019-03-24 LAB — CYTOLOGY - PAP: Diagnosis: NEGATIVE

## 2019-03-31 ENCOUNTER — Encounter: Payer: Self-pay | Admitting: Obstetrics and Gynecology

## 2019-04-01 ENCOUNTER — Encounter: Payer: Self-pay | Admitting: Obstetrics and Gynecology

## 2019-04-01 DIAGNOSIS — D563 Thalassemia minor: Secondary | ICD-10-CM | POA: Insufficient documentation

## 2019-04-14 ENCOUNTER — Encounter: Payer: Medicaid Other | Admitting: Obstetrics and Gynecology

## 2019-04-19 ENCOUNTER — Ambulatory Visit (INDEPENDENT_AMBULATORY_CARE_PROVIDER_SITE_OTHER): Payer: Medicaid Other | Admitting: Obstetrics and Gynecology

## 2019-04-19 ENCOUNTER — Encounter: Payer: Self-pay | Admitting: Obstetrics and Gynecology

## 2019-04-19 ENCOUNTER — Other Ambulatory Visit: Payer: Self-pay

## 2019-04-19 VITALS — BP 103/69 | HR 76 | Wt 121.3 lb

## 2019-04-19 DIAGNOSIS — Z8759 Personal history of other complications of pregnancy, childbirth and the puerperium: Secondary | ICD-10-CM

## 2019-04-19 DIAGNOSIS — O280 Abnormal hematological finding on antenatal screening of mother: Secondary | ICD-10-CM

## 2019-04-19 DIAGNOSIS — D563 Thalassemia minor: Secondary | ICD-10-CM

## 2019-04-19 DIAGNOSIS — O0992 Supervision of high risk pregnancy, unspecified, second trimester: Secondary | ICD-10-CM

## 2019-04-19 DIAGNOSIS — O0990 Supervision of high risk pregnancy, unspecified, unspecified trimester: Secondary | ICD-10-CM

## 2019-04-19 DIAGNOSIS — E010 Iodine-deficiency related diffuse (endemic) goiter: Secondary | ICD-10-CM

## 2019-04-19 DIAGNOSIS — O09292 Supervision of pregnancy with other poor reproductive or obstetric history, second trimester: Secondary | ICD-10-CM

## 2019-04-19 DIAGNOSIS — Z3A15 15 weeks gestation of pregnancy: Secondary | ICD-10-CM

## 2019-04-19 MED ORDER — VITAFOL GUMMIES 3.33-0.333-34.8 MG PO CHEW: 3.0000 | CHEWABLE_TABLET | Freq: Every day | ORAL | 4 refills | Status: AC

## 2019-04-19 NOTE — Addendum Note (Signed)
Addended by: Lewie Loron D on: 04/19/2019 03:32 PM   Modules accepted: Orders

## 2019-04-19 NOTE — Progress Notes (Signed)
Pt is here for ROB. [redacted]w[redacted]d.

## 2019-04-19 NOTE — Progress Notes (Signed)
   PRENATAL VISIT NOTE  Subjective:  Nicole Day is a 28 y.o. G2P1001 at [redacted]w[redacted]d being seen today for ongoing prenatal care.  She is currently monitored for the following issues for this low-risk pregnancy and has Supervision of high risk pregnancy, antepartum; History of prior pregnancy with IUGR newborn; Thyromegaly; Alpha thalassemia silent carrier; and Abnormal maternal serum screening test on their problem list.  Patient reports no complaints.  Contractions: Not present. Vag. Bleeding: None.   . Denies leaking of fluid.   The following portions of the patient's history were reviewed and updated as appropriate: allergies, current medications, past family history, past medical history, past social history, past surgical history and problem list.   Objective:   Vitals:   04/19/19 1043  BP: 103/69  Pulse: 76  Weight: 121 lb 4.8 oz (55 kg)    Fetal Status: Fetal Heart Rate (bpm): 155         General:  Alert, oriented and cooperative. Patient is in no acute distress.  Skin: Skin is warm and dry. No rash noted.   Cardiovascular: Normal heart rate noted  Respiratory: Normal respiratory effort, no problems with respiration noted  Abdomen: Soft, gravid, appropriate for gestational age.  Pain/Pressure: Absent     Pelvic: Cervical exam deferred        Extremities: Normal range of motion.  Edema: None  Mental Status: Normal mood and affect. Normal behavior. Normal judgment and thought content.   Assessment and Plan:  Pregnancy: G2P1001 at [redacted]w[redacted]d  1. Supervision of high risk pregnancy, antepartum Counseled regarding risks/benefits of flu vaccine, patient declines vaccine.   2. History of prior pregnancy with IUGR newborn Anatomy scheduled for 05/09/19  3. Alpha thalassemia silent carrier Ordered for genetic counseling  4. Thyromegaly Normal TSH  Preterm labor symptoms and general obstetric precautions including but not limited to vaginal bleeding, contractions, leaking of fluid  and fetal movement were reviewed in detail with the patient. Please refer to After Visit Summary for other counseling recommendations.   Return in about 4 weeks (around 05/17/2019) for virtual, low OB.  Future Appointments  Date Time Provider [place]  05/09/2019  9:30 AM [facility] [place] 1 WH-M[facility]US M[facility]-US    Sloan Leiter, MD

## 2019-05-09 ENCOUNTER — Ambulatory Visit (HOSPITAL_COMMUNITY): Payer: Self-pay | Admitting: Obstetrics and Gynecology

## 2019-05-09 ENCOUNTER — Ambulatory Visit (HOSPITAL_COMMUNITY)
Admission: RE | Admit: 2019-05-09 | Discharge: 2019-05-09 | Disposition: A | Discharge status: Home / Self Care | Payer: Medicaid Other | Source: Ambulatory Visit | Attending: Obstetrics and Gynecology | Admitting: Obstetrics and Gynecology

## 2019-05-09 ENCOUNTER — Other Ambulatory Visit (HOSPITAL_COMMUNITY): Payer: Self-pay | Admitting: *Deleted

## 2019-05-09 ENCOUNTER — Ambulatory Visit (HOSPITAL_BASED_OUTPATIENT_CLINIC_OR_DEPARTMENT_OTHER): Payer: Medicaid Other | Admitting: Genetic Counselor

## 2019-05-09 ENCOUNTER — Encounter (HOSPITAL_COMMUNITY): Payer: Self-pay

## 2019-05-09 ENCOUNTER — Other Ambulatory Visit: Payer: Self-pay

## 2019-05-09 ENCOUNTER — Ambulatory Visit (HOSPITAL_COMMUNITY): Payer: Medicaid Other | Admitting: *Deleted

## 2019-05-09 VITALS — BP 107/69 | HR 72 | Temp 97.3°F

## 2019-05-09 DIAGNOSIS — D563 Thalassemia minor: Secondary | ICD-10-CM | POA: Insufficient documentation

## 2019-05-09 DIAGNOSIS — E079 Disorder of thyroid, unspecified: Secondary | ICD-10-CM

## 2019-05-09 DIAGNOSIS — Z3A20 20 weeks gestation of pregnancy: Secondary | ICD-10-CM

## 2019-05-09 DIAGNOSIS — O280 Abnormal hematological finding on antenatal screening of mother: Secondary | ICD-10-CM | POA: Insufficient documentation

## 2019-05-09 DIAGNOSIS — Z148 Genetic carrier of other disease: Secondary | ICD-10-CM

## 2019-05-09 DIAGNOSIS — Z3A18 18 weeks gestation of pregnancy: Secondary | ICD-10-CM | POA: Diagnosis not present

## 2019-05-09 DIAGNOSIS — Z362 Encounter for other antenatal screening follow-up: Secondary | ICD-10-CM

## 2019-05-09 DIAGNOSIS — Z315 Encounter for genetic counseling: Secondary | ICD-10-CM

## 2019-05-09 DIAGNOSIS — O99282 Endocrine, nutritional and metabolic diseases complicating pregnancy, second trimester: Secondary | ICD-10-CM | POA: Diagnosis not present

## 2019-05-09 DIAGNOSIS — O0990 Supervision of high risk pregnancy, unspecified, unspecified trimester: Secondary | ICD-10-CM

## 2019-05-09 NOTE — Progress Notes (Signed)
05/09/2019  Nicole Day 1991/01/21 MRN: [id number] DOV: 05/09/2019  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 [name] came to her appointment alone due to COVID-19 visitor restrictions.   Indication for genetic counseling - Silent carrier for alpha-thalassemia - Increased risk to be silent carrier for spinal muscular atrophy (SMA)  Prenatal history  [name] is a G56P1001, 28 y.o. female. Her current pregnancy has completed [redacted]w[redacted]d (Estimated Date of Delivery: 10/05/19).  [name] denied exposure to environmental toxins or chemical agents. She denied the use of alcohol, tobacco or street drugs. She reported taking prenatal vitamins. She denied significant viral illnesses, fevers, and bleeding during the course of her pregnancy. Her medical and surgical histories were noncontributory.  Family History  A three generation pedigree was drafted and reviewed. Both family histories were reviewed and found to be noncontributory for birth defects, intellectual disability, recurrent pregnancy loss, and known genetic conditions.   The patient's ethnicity is [place] [address] and Native [address] ([place]). The father of the pregnancy's ethnicity is African American. Ashkenazi Jewish ancestry and consanguinity were denied. Pedigree will be scanned under Media.  Discussion  [name] had Horizon-14 carrier screening performed through [place]. The results of the screen identified her as a silent carrier for alpha-thalassemia (aa/a-). Alpha-thalassemia is different in its inheritance compared to other hemoglobinopathies as there are two copies of two alpha globin genes (HBA1 and HBA2) on each chromosome 16, or four alpha globin genes total (aa/aa). A person can be a carrier of one alpha gene mutation (aa/a-), also referred to as a "silent carrier". A person  who carries two alpha globin gene mutations can either carry them in cis (both on the same chromosome, denoted as aa/--) or in trans (on different chromosomes, denoted as a-/a-). Alpha-thalassemia carriers of two mutations who have African American ancestry are more likely to have a trans arrangement (a-/a-); cis configuration is reported to be rare in individuals with African American ancestry.     There are several different forms of alpha-thalassemia. The most severe form of alpha-thalassemia, Hb Barts, is associated with an absence of alpha globin chain synthesis as a result of deletions of all four alpha globin genes (--/--).  Given that [name] is a silent carrier (aa/a-), her pregnancies would not be at increased risk for Hb Barts, even if her partner is a carrier for alpha-thalassemia, as she will always pass on at least one copy of the alpha globin gene to her children. Hemoglobin H (HbH) disease is caused by three deleted or dysfunctioning alpha globin alleles (a-/--) and is characterized by microcytic hypochromic hemolytic anemia, hepatosplenomegaly, mild jaundice, growth retardation, and sometimes thalassemia-like bone changes. Given [name]'s silent carrier status (aa/a-), the current fetus would only be at risk for HbH disease (a-/--), if her partner is a carrier for two alpha globin mutations in cis (aa/--). If this is the case, the risk for HbH disease in the pregnancy would be 1 in 4 (25%). However, if [name]'s partner is a carrier for two alpha globin mutations, he would be more likely to carry them in trans configuration (a-/a-) than the cis configuration (aa/--), given his ethnicity. If he is a carrier of alpha-thalassemia in trans, then the pregnancy would not be at increased risk for HbH disease. Based on the carrier frequency for alpha-thalassemia in the African American population, [name]'s partner has a 1 in  30 chance of being any type of carrier for  alpha-thalassemia.   [name] was also found to have 2 copies of the SMN1 gene on Horizon-14 carrier screening; however, she also has the c.*3+80T>G polymorphism of SMN1 in intron 7 (also known as g.[id number]>G). This puts her at increased risk (1 in 2034) to be a silent 2+0 carrier for spinal muscular atrophy (SMA). SMA is a condition caused by mutations in the SMN1 gene. Typically, individuals have two copies of the SMN1 gene, with one copy present on each chromosome. In SMA silent carriers, both copies of the SMN1 gene are present on one chromosome, with no copies of SMN1 present on the other chromosome.  SMA is characterized by progressive muscle weakness and atrophy due to degeneration and loss of anterior horn cells (lower motor neurons) in the spinal cord and brain stem. We discussed the different types of SMA (0, I, II, and III), including differences in severity and age of onset. We also reviewed the autosomal recessive inheritance pattern of SMA. Based on his ethnicity alone, [name]'s partner currently has a 1 in 166 chance of being a carrier of SMA. If he is found to have 2 copies of SMN1, his risk of being a carrier is reduced but not eliminated. If both parents are carriers of SMA, there is a 1 in 4 (25%) chance of having an affected fetus.   [name]'s carrier screening was negative for the other 12 conditions screened. Thus, her risk to be a carrier for these additional conditions (listed separately in the laboratory report) has been reduced but not eliminated. This also significantly reduces her risk of having a child affected by one of these conditions. We discussed that carrier testing for SMA and alpha-thalassemia is recommended for [name]'s partner. [name] indicated that she is interested in pursuing partner carrier screening.  We also reviewed that [name] had Panorama NIPS through the laboratory [name] that was low-risk for fetal aneuploidies. We reviewed that  these results showed a less than 1 in 10,000 risk for trisomies 21, 18 and 13, and monosomy X (Turner syndrome).  In addition, the risk for triploidy and sex chromosome trisomies (47,XXX and 47,XXY) was also low. [name] elected to have cfDNA analysis for 22q11.2 deletion syndrome, which was also low risk (1 in 9000). We reviewed that while this testing identifies 94-99% of pregnancies with trisomies 3621, 6818, and 5913, sex chromosome aneuploidies, and triploidy, it is NOT diagnostic. A positive test result requires confirmation by CVS or amniocentesis, and a negative test result does not rule out a fetal chromosome abnormality. She also understands that this testing does not identify all genetic conditions.  A complete ultrasound was performed today prior to our visit. The ultrasound report will be sent under separate cover. There were no visualized fetal anomalies or markers suggestive of aneuploidy.   [name] was also counseled regarding diagnostic testing via amniocentesis. We discussed the technical aspects of the procedure and quoted up to a 1 in 500 (0.2%) risk for spontaneous pregnancy loss or other adverse pregnancy outcomes as a result of amniocentesis. Cultured cells from an amniocentesis sample allow for the visualization of a fetal karyotype, which can detect >99% of chromosomal aberrations. Chromosomal microarray can also be performed to identify smaller deletions or duplications of fetal chromosomal material. Amniocentesis could also be performed to assess whether the baby is affected by SMA or alpha-thalassemia. After careful consideration, Ms. Candelas declined amniocentesis at this time. She understands  that amniocentesis is available at any point after 16 weeks of pregnancy and that she may opt to undergo the procedure at a later date should she change her mind.  Lastly, the patient was made aware that screening for open neural tube defects (ONTDs) via MS-AFP in the second trimester in  addition to level II ultrasound examination is recommended. We reviewed that Ms. Mcgraw's level II ultrasound did not detect any ONTDs, and that level II ultrasound is able to detect them with 90-95% sensitivity. However, normal results from any of the above options do not guarantee a normal baby, as 3-5% of newborns have some type of birth defect, many of which are not prenatally diagnosable.  Ms. Pitones desires carrier screening for her partner, Delray Alt. However, she was not sure about his health insurance coverage. We discussed that if Mr. Dianah Field does not have insurance, I could potentially facilitate free testing through the laboratory [facility]'s Patient Assistance Program. I instructed Ms. Netter to email me with her partner's insurance information once she has had an opportunity to discuss carrier screening with him. I will facilitate sample collection and the testing process from there. Results will take 2-3 weeks to be delivered from the time the laboratory receives the sample. I will call Ms. Penland when results become available.  I counseled Ms. Tolley regarding the above risks and available options. The approximate face-to-face time with the genetic counselor was 25 minutes.  In summary:  Discussed carrier screening results and options for follow-up testing  Silent carrier for alpha-thalassemia  Increased risk (1 in 8) to be silent carrier for spinal muscular atrophy  Desires partner carrier screening. Will email me with partner's insurance information and I will facilitate testing from there  Reviewed low-risk NIPS results  Reduction in risk for Down syndrome, trisomy 76, trisomy 55, sex chromosome aneuploidies, and 22q11.2 deletion syndrome  Reviewed results of ultrasound  No fetal anomalies or markers seen  Reduction in risk for fetal aneuploidy  Offered additional testing and screening  Declined amniocentesis  Recommend MS-AFP screening  Reviewed  family history concerns   Buelah Manis, [place]

## 2019-05-17 ENCOUNTER — Telehealth (INDEPENDENT_AMBULATORY_CARE_PROVIDER_SITE_OTHER): Payer: Medicaid Other | Admitting: Obstetrics

## 2019-05-17 ENCOUNTER — Encounter: Payer: Self-pay | Admitting: Obstetrics

## 2019-05-17 DIAGNOSIS — O0992 Supervision of high risk pregnancy, unspecified, second trimester: Secondary | ICD-10-CM

## 2019-05-17 DIAGNOSIS — Z3A21 21 weeks gestation of pregnancy: Secondary | ICD-10-CM

## 2019-05-17 DIAGNOSIS — Z8759 Personal history of other complications of pregnancy, childbirth and the puerperium: Secondary | ICD-10-CM

## 2019-05-17 DIAGNOSIS — D563 Thalassemia minor: Secondary | ICD-10-CM

## 2019-05-17 DIAGNOSIS — M549 Dorsalgia, unspecified: Secondary | ICD-10-CM

## 2019-05-17 DIAGNOSIS — O0990 Supervision of high risk pregnancy, unspecified, unspecified trimester: Secondary | ICD-10-CM

## 2019-05-17 MED ORDER — COMFORT FIT MATERNITY SUPP SM MISC: 0 refills | Status: AC

## 2019-05-17 NOTE — Progress Notes (Signed)
TELEHEALTH OBSTETRICS PRENATAL VIRTUAL VIDEO VISIT ENCOUNTER NOTE  Provider location: [facility]   I connected with Nicole Day on 05/17/19 at 10:30 AM EST by OB MyChart Video Encounter at home and verified that I am speaking with the correct person using two identifiers.   I discussed the limitations, risks, security and privacy concerns of performing an evaluation and management service virtually and the availability of in person appointments. I also discussed with the patient that there may be a patient responsible charge related to this service. The patient expressed understanding and agreed to proceed.   Subjective:  Nicole Day is a 28 y.o. G2P1001 at [redacted]w[redacted]d being seen today for ongoing prenatal care.  She is currently monitored for the following issues for this high-risk pregnancy and has Supervision of high risk pregnancy, antepartum; History of prior pregnancy with IUGR newborn; Thyromegaly; Alpha thalassemia silent carrier; and Abnormal maternal serum screening test on their problem list.  Patient reports no complaints.   .  .   . Denies any leaking of fluid.   The following portions of the patient's history were reviewed and updated as appropriate: allergies, current medications, past family history, past medical history, past social history, past surgical history and problem list.   Objective:  There were no vitals filed for this visit.  Fetal Status:           General:  Alert, oriented and cooperative. Patient is in no acute distress.  Respiratory: Normal respiratory effort, no problems with respiration noted  Mental Status: Normal mood and affect. Normal behavior. Normal judgment and thought content.  Rest of physical exam deferred due to type of encounter  Imaging: [place] Mfm Ob Detail +14 Wk  Result Date: 05/09/2019 ----------------------------------------------------------------------  OBSTETRICS REPORT                       (Signed  Final 05/09/2019 11:34 am) ---------------------------------------------------------------------- Patient Info  ID #:       [id number]                          D.O.B.:  10-26-1990 (28 yrs)  Name:       Nicole Day                 Visit Date: 05/09/2019 10:02 am ---------------------------------------------------------------------- Performed By  Performed By:     Valda Favia          Ref. Address:     [address]  [place]                                                             [id number]  Attending:        Ma Rings MD         [facility]  Referred By:      [facility] ---------------------------------------------------------------------- Orders   #  Description                          Code         Ordered By   1  [place] MFM OB DETAIL +14 WK              76811.01     KELLY DAVIS  ----------------------------------------------------------------------   #  Order #                    Accession #                 Episode #   1  [id number]                  [id number]                  [id number]  ---------------------------------------------------------------------- Indications   Thyroid disease in pregnancy (thryomegaly)     O99.280, E07.9   (nl TSH level 10/1)   Poor obstetric history: Previous fetal growth  O09.299   restriction (FGR)   Abnormal biochemical screen (silent alpha      O28.9   thal carrier, increased risk for SMA)   Encounter for antenatal screening for          Z36.3   malformations (low risk NIPS, 15.4FF)   [redacted] weeks gestation of pregnancy                Z3A.20  ---------------------------------------------------------------------- Fetal Evaluation  Num Of Fetuses:         1  Fetal Heart  Rate(bpm):  142  Cardiac Activity:       Observed  Presentation:           Cephalic  Placenta:               Posterior  P. Cord Insertion:      Visualized, central  Amniotic Fluid  AFI FV:      Within normal limits                              Largest Pocket(cm)                              5.84 ---------------------------------------------------------------------- Biometry  BPD:      48.4  mm     G. Age:  20w 4d         52  %    CI:        71.41   %    70 - 86                                                          FL/HC:      17.8   %    15.9 - 20.3  HC:      182.4  mm     G. Age:  20w 4d         44  %    HC/AC:      1.15        1.06 - 1.25  AC:      158.6  mm     G. Age:  21w 0d         58  %    FL/BPD:     66.9   %  FL:       32.4  mm     G. Age:  20w 1d         26  %    FL/AC:      20.4   %    20 - 24  HUM:      32.2  mm     G. Age:  20w 5d         55  %  CER:      21.7  mm     G. Age:  20w 5d         52  %  CM:        5.6  mm  Est. FW:     364  gm    0 lb 13 oz      46  % ---------------------------------------------------------------------- OB History  Gravidity:    2         Term:   1        Prem:   0        SAB:   0  TOP:          0       Ectopic:  0        Living: 1 ---------------------------------------------------------------------- Gestational Age  LMP:           18w 5d        Date:  12/29/18                 EDD:   10/05/19  U/S Today:     20w 4d                                        EDD:   09/22/19  Best:          20w 4d     Det. By:  U/S (05/09/19)           EDD:   09/22/19 ---------------------------------------------------------------------- Anatomy  Cranium:               Appears normal         Aortic Arch:            Not well visualized  Cavum:  Appears normal         Ductal Arch:            Not well visualized  Ventricles:            Appears normal         Diaphragm:              Appears normal  Choroid Plexus:        Appears normal         Stomach:                Appears  normal, left                                                                        sided  Cerebellum:            Appears normal         Abdomen:                Appears normal  Posterior Fossa:       Appears normal         Abdominal Wall:         Appears nml (cord                                                                        insert, abd wall)  Nuchal Fold:           Not applicable (>20    Cord Vessels:           Appears normal ([redacted]                         wks GA)                                        vessel cord)  Face:                  Appears normal         Kidneys:                Appear normal                         (orbits and profile)  Lips:                  Appears normal         Bladder:                Appears normal  Thoracic:              Appears normal         Spine:                  Appears normal  Heart:  Not well visualized    Upper Extremities:      Appears normal  RVOT:                  Appears normal         Lower Extremities:      Appears normal  LVOT:                  Not well visualized  Other:  Heels and 5th digit visualized. Technically difficult due to fetal position. ---------------------------------------------------------------------- Cervix Uterus Adnexa  Cervix  Length:            3.1  cm.  Normal appearance by transabdominal scan.  Uterus  No abnormality visualized.  Left Ovary  No adnexal mass visualized.  Right Ovary  No adnexal mass visualized.  Cul De Sac  No free fluid seen.  Adnexa  No abnormality visualized. ---------------------------------------------------------------------- Comments  This patient was seen for a detailed fetal anatomy scan due  to a history of IUGR in her prior pregnancy.  The patient has  screened positive as a silent carrier for the alpha thalassemia  trait.  She is also at increased risk for being a spinal muscular  atrophy carrier. She denies any other significant past medical  history and denies any problems in her current pregnancy.   She had a cell free DNA test earlier in her pregnancy which  indicated a low risk for trisomy 80, 58, and 13. A female fetus is  predicted.  She was informed that the fetal growth and amniotic fluid  level were appropriate for her gestational age.  There were no obvious fetal anomalies noted on today's  ultrasound exam.  Today's exam was limited due to the fetal  position.  The patient was informed that anomalies may be missed due  to technical limitations. If the fetus is in a suboptimal position  or maternal habitus is increased, visualization of the fetus in  the maternal uterus may be impaired.  The patient is meeting with the genetic counselor following  today's exam to discuss the significance of being a silent  carrier for the alpha thalassemia trait and spinal muscular  atrophy.  Due to her history of a prior IUGR fetus, we will continue to  follow her with serial growth ultrasounds.  A follow-up growth scan was scheduled in 4 weeks. ----------------------------------------------------------------------                   Ma Rings, MD Electronically Signed Final Report   05/09/2019 11:34 am ----------------------------------------------------------------------   Assessment and Plan:  Pregnancy: G2P1001 at [redacted]w[redacted]d 1. Supervision of high risk pregnancy, antepartum  2. History of prior pregnancy with IUGR newborn  3. Alpha thalassemia silent carrier  4. Backache symptom Rx: - Elastic Bandages & Supports (COMFORT FIT MATERNITY SUPP SM) MISC; Wear as directed.  Dispense: 1 each; Refill: 0    Preterm labor symptoms and general obstetric precautions including but not limited to vaginal bleeding, contractions, leaking of fluid and fetal movement were reviewed in detail with the patient. I discussed the assessment and treatment plan with the patient. The patient was provided an opportunity to ask questions and all were answered. The patient agreed with the plan and demonstrated an understanding of the  instructions. The patient was advised to call back or seek an in-person office evaluation/go to MAU at [facility] for any urgent or concerning symptoms. Please refer to After Visit Summary for other counseling recommendations.  I provided 10 minutes of face-to-face time during this encounter.  Return in about 4 weeks (around 06/14/2019) for MyChart HOB-Faculty Only.  Future Appointments  Date Time Provider Department Center  06/06/2019 11:15 AM [facility]-M[facility] NURSE [facility]-MFC MFC-US  06/06/2019 11:15 AM [facility]-MFC [place] 4 WH-MFCUS MFC-US  12/27/2019 11:00 AM Reather Littler, MD [facility] None    Coral Ceo, MD [facility] for [facility], [facility] 05/17/2019

## 2019-06-06 ENCOUNTER — Ambulatory Visit (HOSPITAL_COMMUNITY): Payer: Medicaid Other | Admitting: *Deleted

## 2019-06-06 ENCOUNTER — Encounter (HOSPITAL_COMMUNITY): Payer: Self-pay

## 2019-06-06 ENCOUNTER — Other Ambulatory Visit: Payer: Self-pay

## 2019-06-06 ENCOUNTER — Other Ambulatory Visit (HOSPITAL_COMMUNITY): Payer: Self-pay | Admitting: *Deleted

## 2019-06-06 ENCOUNTER — Ambulatory Visit (HOSPITAL_COMMUNITY)
Admission: RE | Admit: 2019-06-06 | Discharge: 2019-06-06 | Disposition: A | Discharge status: Home / Self Care | Payer: Medicaid Other | Source: Ambulatory Visit | Attending: Obstetrics and Gynecology | Admitting: Obstetrics and Gynecology

## 2019-06-06 VITALS — BP 116/74 | HR 80 | Temp 97.1°F

## 2019-06-06 DIAGNOSIS — O0990 Supervision of high risk pregnancy, unspecified, unspecified trimester: Secondary | ICD-10-CM | POA: Diagnosis present

## 2019-06-06 DIAGNOSIS — Z3A24 24 weeks gestation of pregnancy: Secondary | ICD-10-CM

## 2019-06-06 DIAGNOSIS — O99282 Endocrine, nutritional and metabolic diseases complicating pregnancy, second trimester: Secondary | ICD-10-CM | POA: Diagnosis not present

## 2019-06-06 DIAGNOSIS — E079 Disorder of thyroid, unspecified: Secondary | ICD-10-CM

## 2019-06-06 DIAGNOSIS — Z362 Encounter for other antenatal screening follow-up: Secondary | ICD-10-CM

## 2019-06-06 DIAGNOSIS — O09292 Supervision of pregnancy with other poor reproductive or obstetric history, second trimester: Secondary | ICD-10-CM | POA: Diagnosis not present

## 2019-06-06 DIAGNOSIS — O289 Unspecified abnormal findings on antenatal screening of mother: Secondary | ICD-10-CM

## 2019-06-06 DIAGNOSIS — Z8759 Personal history of other complications of pregnancy, childbirth and the puerperium: Secondary | ICD-10-CM

## 2019-06-14 ENCOUNTER — Telehealth (INDEPENDENT_AMBULATORY_CARE_PROVIDER_SITE_OTHER): Payer: Medicaid Other | Admitting: Obstetrics & Gynecology

## 2019-06-14 ENCOUNTER — Other Ambulatory Visit: Payer: Self-pay

## 2019-06-14 DIAGNOSIS — Z3A25 25 weeks gestation of pregnancy: Secondary | ICD-10-CM | POA: Diagnosis not present

## 2019-06-14 DIAGNOSIS — O0992 Supervision of high risk pregnancy, unspecified, second trimester: Secondary | ICD-10-CM

## 2019-06-14 DIAGNOSIS — O0990 Supervision of high risk pregnancy, unspecified, unspecified trimester: Secondary | ICD-10-CM

## 2019-06-14 NOTE — Progress Notes (Signed)
   TELEHEALTH VIRTUAL OBSTETRICS VISIT ENCOUNTER NOTE  I connected with Nicole Day on 06/14/19 at 10:45 AM EST by telephone at home and verified that I am speaking with the correct person using two identifiers.   I discussed the limitations, risks, security and privacy concerns of performing an evaluation and management service by telephone and the availability of in person appointments. I also discussed with the patient that there may be a patient responsible charge related to this service. The patient expressed understanding and agreed to proceed.  Subjective:  Nicole Day is a 28 y.o. G2P1001 at [redacted]w[redacted]d being followed for ongoing prenatal care.  She is currently monitored for the following issues for this high-risk pregnancy and has Supervision of high risk pregnancy, antepartum; History of prior pregnancy with IUGR newborn; Thyromegaly; Alpha thalassemia silent carrier; and Abnormal maternal serum screening test on their problem list.  Patient reports no complaints. Reports fetal movement. Denies any contractions, bleeding or leaking of fluid.   The following portions of the patient's history were reviewed and updated as appropriate: allergies, current medications, past family history, past medical history, past social history, past surgical history and problem list.   Objective:   General:  Alert, oriented and cooperative.   Mental Status: Normal mood and affect perceived. Normal judgment and thought content.  Rest of physical exam deferred due to type of encounter  Assessment and Plan:  Pregnancy: G2P1001 at [redacted]w[redacted]d 1. Supervision of high risk pregnancy, antepartum F/u growth [place] in 3  Preterm labor symptoms and general obstetric precautions including but not limited to vaginal bleeding, contractions, leaking of fluid and fetal movement were reviewed in detail with the patient.  I discussed the assessment and treatment plan with the patient. The patient was provided an opportunity  to ask questions and all were answered. The patient agreed with the plan and demonstrated an understanding of the instructions. The patient was advised to call back or seek an in-person office evaluation/go to MAU at [facility] for any urgent or concerning symptoms. Please refer to After Visit Summary for other counseling recommendations.   I provided 10 minutes of non-face-to-face time during this encounter.  Return in about 2 weeks (around 06/28/2019) for 2 hr GTT.  Future Appointments  Date Time Provider [place]  07/05/2019 11:30 AM [place] M[facility]-US  07/05/2019 11:30 AM WH-MFC [place] 5 WH-MFCUS MFC-US  12/27/2019 11:00 AM Elayne Snare, MD [facility] None    Emeterio Reeve, MD [facility] for [place], [place]

## 2019-06-14 NOTE — Progress Notes (Signed)
S/w pt for virtual visit. Pt reports fetal movement, denies pain. 

## 2019-06-14 NOTE — Patient Instructions (Signed)

## 2019-06-28 ENCOUNTER — Encounter: Payer: Self-pay | Admitting: Obstetrics and Gynecology

## 2019-06-28 ENCOUNTER — Other Ambulatory Visit: Payer: Medicaid Other

## 2019-06-28 ENCOUNTER — Ambulatory Visit (INDEPENDENT_AMBULATORY_CARE_PROVIDER_SITE_OTHER): Payer: Medicaid Other | Admitting: Obstetrics and Gynecology

## 2019-06-28 ENCOUNTER — Other Ambulatory Visit: Payer: Self-pay

## 2019-06-28 VITALS — BP 108/70 | HR 82 | Wt 133.0 lb

## 2019-06-28 DIAGNOSIS — O0992 Supervision of high risk pregnancy, unspecified, second trimester: Secondary | ICD-10-CM

## 2019-06-28 DIAGNOSIS — Z3A27 27 weeks gestation of pregnancy: Secondary | ICD-10-CM

## 2019-06-28 DIAGNOSIS — O0990 Supervision of high risk pregnancy, unspecified, unspecified trimester: Secondary | ICD-10-CM

## 2019-06-28 DIAGNOSIS — Z8759 Personal history of other complications of pregnancy, childbirth and the puerperium: Secondary | ICD-10-CM

## 2019-06-28 DIAGNOSIS — D563 Thalassemia minor: Secondary | ICD-10-CM

## 2019-06-28 NOTE — Progress Notes (Signed)
Pt states she is having some back pain at night, making it hard for her to sleep.

## 2019-06-28 NOTE — Progress Notes (Signed)
   PRENATAL VISIT NOTE  Subjective:  Nicole Day is a 29 y.o. G2P1001 at [redacted]w[redacted]d being seen today for ongoing prenatal care.  She is currently monitored for the following issues for this high-risk pregnancy and has Supervision of high risk pregnancy, antepartum; History of prior pregnancy with IUGR newborn; Thyromegaly; Alpha thalassemia silent carrier; and Abnormal maternal serum screening test on their problem list.  Patient reports no complaints.  Contractions: Not present. Vag. Bleeding: None.  Movement: Present. Denies leaking of fluid.   The following portions of the patient's history were reviewed and updated as appropriate: allergies, current medications, past family history, past medical history, past social history, past surgical history and problem list.   Objective:   Vitals:   06/28/19 0924  BP: 108/70  Pulse: 82  Weight: 133 lb (60.3 kg)    Fetal Status: Fetal Heart Rate (bpm): 145 Fundal Height: 28 cm Movement: Present     General:  Alert, oriented and cooperative. Patient is in no acute distress.  Skin: Skin is warm and dry. No rash noted.   Cardiovascular: Normal heart rate noted  Respiratory: Normal respiratory effort, no problems with respiration noted  Abdomen: Soft, gravid, appropriate for gestational age.  Pain/Pressure: Present     Pelvic: Cervical exam deferred        Extremities: Normal range of motion.     Mental Status: Normal mood and affect. Normal behavior. Normal judgment and thought content.   Assessment and Plan:  Pregnancy: G2P1001 at [redacted]w[redacted]d 1. Supervision of high risk pregnancy, antepartum Patient is doing well without complaints Third trimester labs with glucola today  2. Alpha thalassemia silent carrier   3. History of prior pregnancy with IUGR newborn Follow up growth on 07/05/19  Preterm labor symptoms and general obstetric precautions including but not limited to vaginal bleeding, contractions, leaking of fluid and fetal movement were  reviewed in detail with the patient. Please refer to After Visit Summary for other counseling recommendations.   Return in about 2 weeks (around 07/12/2019) for Virtual, ROB, High risk.  Future Appointments  Date Time Provider Department Center  07/05/2019 11:30 AM [facility] NURSE [facility] MFC-US  07/05/2019 11:30 AM [facility] [place] 5 WH-MFCUS MFC-US  12/27/2019 11:00 AM Reather Littler, MD [facility] None    Catalina Antigua, MD

## 2019-06-29 LAB — GLUCOSE TOLERANCE, 2 HOURS W/ 1HR
Glucose, 1 hour: 136 mg/dL (ref 65–179)
Glucose, 2 hour: 98 mg/dL (ref 65–152)
Glucose, Fasting: 79 mg/dL (ref 65–91)

## 2019-06-29 LAB — CBC
Hematocrit: 28.7 % — ABNORMAL LOW (ref 34.0–46.6)
Hemoglobin: 10.1 g/dL — ABNORMAL LOW (ref 11.1–15.9)
MCH: 30.3 pg (ref 26.6–33.0)
MCHC: 35.2 g/dL (ref 31.5–35.7)
MCV: 86 fL (ref 79–97)
Platelets: 179 10*3/uL (ref 150–450)
RBC: 3.33 x10E6/uL — ABNORMAL LOW (ref 3.77–5.28)
RDW: 12.1 % (ref 11.7–15.4)
WBC: 5.3 10*3/uL (ref 3.4–10.8)

## 2019-06-29 LAB — HIV ANTIBODY (ROUTINE TESTING W REFLEX): HIV Screen 4th Generation wRfx: NONREACTIVE

## 2019-06-29 LAB — RPR: RPR Ser Ql: NONREACTIVE

## 2019-07-05 ENCOUNTER — Other Ambulatory Visit: Payer: Self-pay

## 2019-07-05 ENCOUNTER — Ambulatory Visit (HOSPITAL_COMMUNITY)
Admission: RE | Admit: 2019-07-05 | Discharge: 2019-07-05 | Disposition: A | Discharge status: Home / Self Care | Payer: Medicaid Other | Source: Ambulatory Visit | Attending: Obstetrics and Gynecology | Admitting: Obstetrics and Gynecology

## 2019-07-05 ENCOUNTER — Ambulatory Visit (HOSPITAL_COMMUNITY): Payer: Medicaid Other | Admitting: *Deleted

## 2019-07-05 ENCOUNTER — Other Ambulatory Visit (HOSPITAL_COMMUNITY): Payer: Self-pay | Admitting: *Deleted

## 2019-07-05 ENCOUNTER — Encounter (HOSPITAL_COMMUNITY): Payer: Self-pay

## 2019-07-05 VITALS — BP 114/66 | HR 74 | Temp 96.6°F

## 2019-07-05 DIAGNOSIS — Z362 Encounter for other antenatal screening follow-up: Secondary | ICD-10-CM

## 2019-07-05 DIAGNOSIS — O0990 Supervision of high risk pregnancy, unspecified, unspecified trimester: Secondary | ICD-10-CM

## 2019-07-05 DIAGNOSIS — Z8759 Personal history of other complications of pregnancy, childbirth and the puerperium: Secondary | ICD-10-CM | POA: Diagnosis present

## 2019-07-05 DIAGNOSIS — O09293 Supervision of pregnancy with other poor reproductive or obstetric history, third trimester: Secondary | ICD-10-CM

## 2019-07-05 DIAGNOSIS — E079 Disorder of thyroid, unspecified: Secondary | ICD-10-CM | POA: Diagnosis not present

## 2019-07-05 DIAGNOSIS — O99283 Endocrine, nutritional and metabolic diseases complicating pregnancy, third trimester: Secondary | ICD-10-CM | POA: Diagnosis not present

## 2019-07-05 DIAGNOSIS — O289 Unspecified abnormal findings on antenatal screening of mother: Secondary | ICD-10-CM

## 2019-07-05 DIAGNOSIS — Z3A28 28 weeks gestation of pregnancy: Secondary | ICD-10-CM

## 2019-07-12 ENCOUNTER — Telehealth (INDEPENDENT_AMBULATORY_CARE_PROVIDER_SITE_OTHER): Payer: Medicaid Other | Admitting: Obstetrics and Gynecology

## 2019-07-12 ENCOUNTER — Encounter: Payer: Self-pay | Admitting: Obstetrics and Gynecology

## 2019-07-12 VITALS — BP 119/79 | HR 79

## 2019-07-12 DIAGNOSIS — D563 Thalassemia minor: Secondary | ICD-10-CM

## 2019-07-12 DIAGNOSIS — Z3A29 29 weeks gestation of pregnancy: Secondary | ICD-10-CM

## 2019-07-12 DIAGNOSIS — Z8759 Personal history of other complications of pregnancy, childbirth and the puerperium: Secondary | ICD-10-CM

## 2019-07-12 DIAGNOSIS — O0993 Supervision of high risk pregnancy, unspecified, third trimester: Secondary | ICD-10-CM

## 2019-07-12 DIAGNOSIS — O0990 Supervision of high risk pregnancy, unspecified, unspecified trimester: Secondary | ICD-10-CM

## 2019-07-12 NOTE — Progress Notes (Signed)
Pt states she is having some upper abd pain, ?stretching.

## 2019-07-12 NOTE — Progress Notes (Signed)
Subjective:     TELEHEALTH OBSTETRICS PRENATAL VIRTUAL VIDEO VISIT ENCOUNTER NOTE  Provider location: [facility] for [facility] [place]   I connected with Nicole Day on 07/12/19 at  3:00 PM EST by MyChart Video Encounter at home and verified that I am speaking with the correct person using two identifiers.   I discussed the limitations, risks, security and privacy concerns of performing an evaluation and management service virtually and the availability of in person appointments. I also discussed with the patient that there may be a patient responsible charge related to this service. The patient expressed understanding and agreed to proceed. Subjective:  Nicole Day is a 29 y.o. G2P1001 at [redacted]w[redacted]d being seen today for ongoing prenatal care.  She is currently monitored for the following issues for this high-risk pregnancy and has Supervision of high risk pregnancy, antepartum; History of prior pregnancy with IUGR newborn; Thyromegaly; Alpha thalassemia silent carrier; and Abnormal maternal serum screening test on their problem list.  Patient reports general discomforts of pregnancy.  Contractions: Not present. Vag. Bleeding: None.  Movement: Present. Denies any leaking of fluid.   The following portions of the patient's history were reviewed and updated as appropriate: allergies, current medications, past family history, past medical history, past social history, past surgical history and problem list.   Objective:   Vitals:   07/12/19 1511  BP: 119/79  Pulse: 79    Fetal Status:     Movement: Present     General:  Alert, oriented and cooperative. Patient is in no acute distress.  Respiratory: Normal respiratory effort, no problems with respiration noted  Mental Status: Normal mood and affect. Normal behavior. Normal judgment and thought content.  Rest of physical exam deferred due to type of encounter  Imaging: [place] MFM OB FOLLOW UP  Result Date:  07/05/2019 ----------------------------------------------------------------------  OBSTETRICS REPORT                       (Signed Final 07/05/2019 01:17 pm) ---------------------------------------------------------------------- Patient Info  ID #:       [id number]                          D.O.B.:  01/28/1991 (28 yrs)  Name:       Nicole Day                 Visit Date: 07/05/2019 11:52 am ---------------------------------------------------------------------- Performed By  Performed By:     Percell [place]          Ref. Address:     [address]                                                             [address] [id number]  [place]                                                             [id number]  Attending:        Ma RingsVictor Fang MD         [facility]  Referred By:      [facility] ---------------------------------------------------------------------- Orders   #  Description                          Code         Ordered By   1  US MFM OB FOLLOW UP                  [id number]     [facility]  ----------------------------------------------------------------------   #  Order #                    Accession #                 Episode #   1  [id number]                  [id number]                  [id number]  ---------------------------------------------------------------------- Indications   Poor obstetric history: Previous fetal growth  O09.299   restriction (FGR)   [redacted] weeks gestation of pregnancy                Z3A.28   Encounter for other antenatal screening        Z36.2   follow-up (low risk NIPS)   Thyroid disease in pregnancy (thryomegaly)     O99.280, E07.9   (nl TSH level 10/1)   Abnormal biochemical screen (silent alpha      O28.9   thal carrier, increased risk for SMA)   ---------------------------------------------------------------------- Fetal Evaluation  Num Of Fetuses:         1  Fetal Heart Rate(bpm):  151  Cardiac Activity:       Observed  Presentation:           Cephalic  Placenta:               Posterior  P. Cord Insertion:      Previously Visualized  Amniotic Fluid  AFI FV:      Within normal limits  AFI Sum(cm)     %Tile       Largest Pocket(cm)  17.46           66          5  RUQ(cm)       RLQ(cm)       LUQ(cm)        LLQ(cm)  4.32          5  4.32           3.82 ---------------------------------------------------------------------- Biometry  BPD:      73.9  mm     G. Age:  29w 5d         68  %    CI:         75.6   %    70 - 86                                                          FL/HC:      20.0   %    19.6 - 20.8  HC:      269.5  mm     G. Age:  29w 3d         37  %    HC/AC:      1.12        0.99 - 1.21  AC:      241.3  mm     G. Age:  28w 3d         33  %    FL/BPD:     72.9   %    71 - 87  FL:       53.9  mm     G. Age:  28w 4d         30  %    FL/AC:      22.3   %    20 - 24  Est. FW:    1258  gm    2 lb 12 oz      34  % ---------------------------------------------------------------------- OB History  Gravidity:    2         Term:   1        Prem:   0        SAB:   0  TOP:          0       Ectopic:  0        Living: 1 ---------------------------------------------------------------------- Gestational Age  LMP:           26w 6d        Date:  12/29/18                 EDD:   10/05/19  U/S Today:     29w 0d                                        EDD:   09/20/19  Best:          28w 5d     Det. By:  U/S  (05/09/19)          EDD:   09/22/19 ---------------------------------------------------------------------- Anatomy  Cranium:               Appears normal         Aortic Arch:            Previously seen  Cavum:                 Previously seen        Ductal Arch:  Previously seen  Ventricles:            Appears normal         Diaphragm:               Previously seen  Choroid Plexus:        Previously seen        Stomach:                Appears normal, left                                                                        sided  Cerebellum:            Previously seen        Abdomen:                Previously seen  Posterior Fossa:       Previously seen        Abdominal Wall:         Previously seen  Nuchal Fold:           Not applicable (>20    Cord Vessels:           Previously seen                         wks GA)  Face:                  Orbits and profile     Kidneys:                Appear normal                         previously seen  Lips:                  Previously seen        Bladder:                Appears normal  Thoracic:              Appears normal         Spine:                  Previously seen  Heart:                 Previously seen        Upper Extremities:      Previously seen  RVOT:                  Appears normal         Lower Extremities:      Previously seen  LVOT:                  Appears normal  Other:  Heels and 5th digit visualized previously. Technically difficult due to          fetal position. ---------------------------------------------------------------------- Cervix Uterus Adnexa  Cervix  Not visualized (advanced GA >24wks)  Uterus  No abnormality visualized.  Left Ovary  No adnexal mass visualized.  Right Ovary  No adnexal mass visualized.  Cul De Sac  No free fluid seen.  Adnexa  No abnormality visualized. ---------------------------------------------------------------------- Comments  This patient was seen for a follow up growth scan due to a  prior pregnancy that was complicated by an IUGR fetus.  She  denies any problems since her last exam.  She was informed that the fetal growth and amniotic fluid  level appears appropriate for her gestational age.  Due to her history, a follow-up growth scan was scheduled in  5 weeks. ----------------------------------------------------------------------                   Johnell Comings,  MD Electronically Signed Final Report   07/05/2019 01:17 pm ----------------------------------------------------------------------   Assessment and Plan:  Pregnancy: G2P1001 at [redacted]w[redacted]d 1. Supervision of high risk pregnancy, antepartum Stable   2. History of prior pregnancy with IUGR newborn Growth approriate earlier this month, follow up next month  3. Alpha thalassemia silent carrier   Preterm labor symptoms and general obstetric precautions including but not limited to vaginal bleeding, contractions, leaking of fluid and fetal movement were reviewed in detail with the patient. I discussed the assessment and treatment plan with the patient. The patient was provided an opportunity to ask questions and all were answered. The patient agreed with the plan and demonstrated an understanding of the instructions. The patient was advised to call back or seek an in-person office evaluation/go to MAU at [facility] for any urgent or concerning symptoms. Please refer to After Visit Summary for other counseling recommendations.   I provided 8 minutes of face-to-face time during this encounter.  Return in about 2 weeks (around 07/26/2019) for OB visit, virtual .  Future Appointments  Date Time Provider [place]  08/10/2019 10:30 AM [place] MFC-US  08/10/2019 10:45 AM [place] 5 WH-MFCUS MFC-US  12/27/2019 11:00 AM Elayne Snare, MD [facility] None    Chancy Milroy, [place] [facility] [place]

## 2019-07-26 ENCOUNTER — Telehealth (INDEPENDENT_AMBULATORY_CARE_PROVIDER_SITE_OTHER): Payer: Medicaid Other | Admitting: Obstetrics & Gynecology

## 2019-07-26 VITALS — BP 104/76

## 2019-07-26 DIAGNOSIS — O0993 Supervision of high risk pregnancy, unspecified, third trimester: Secondary | ICD-10-CM | POA: Diagnosis not present

## 2019-07-26 DIAGNOSIS — Z8759 Personal history of other complications of pregnancy, childbirth and the puerperium: Secondary | ICD-10-CM

## 2019-07-26 DIAGNOSIS — Z3A31 31 weeks gestation of pregnancy: Secondary | ICD-10-CM

## 2019-07-26 DIAGNOSIS — O280 Abnormal hematological finding on antenatal screening of mother: Secondary | ICD-10-CM | POA: Diagnosis not present

## 2019-07-26 DIAGNOSIS — O0990 Supervision of high risk pregnancy, unspecified, unspecified trimester: Secondary | ICD-10-CM

## 2019-07-26 NOTE — Patient Instructions (Signed)

## 2019-07-26 NOTE — Progress Notes (Signed)
   TELEHEALTH VIRTUAL OBSTETRICS VISIT ENCOUNTER NOTE  I connected with Nicole Day on 07/26/19 at 10:30 AM EST by telephone at home and verified that I am speaking with the correct person using two identifiers.   I discussed the limitations, risks, security and privacy concerns of performing an evaluation and management service by telephone and the availability of in person appointments. I also discussed with the patient that there may be a patient responsible charge related to this service. The patient expressed understanding and agreed to proceed.  Subjective:  Nicole Day is a 29 y.o. G2P1001 at [redacted]w[redacted]d being followed for ongoing prenatal care.  She is currently monitored for the following issues for this high-risk pregnancy and has Supervision of high risk pregnancy, antepartum; History of prior pregnancy with IUGR newborn; Thyromegaly; Alpha thalassemia silent carrier; and Abnormal maternal serum screening test on their problem list.  Patient reports no complaints. Reports fetal movement. Denies any contractions, bleeding or leaking of fluid.   The following portions of the patient's history were reviewed and updated as appropriate: allergies, current medications, past family history, past medical history, past social history, past surgical history and problem list.   Objective:   General:  Alert, oriented and cooperative.   Mental Status: Normal mood and affect perceived. Normal judgment and thought content.  Rest of physical exam deferred due to type of encounter  Assessment and Plan:  Pregnancy: G2P1001 at [redacted]w[redacted]d 1. Supervision of high risk pregnancy, antepartum F/u US showed normal growth  2. History of prior pregnancy with IUGR newborn No IUGR this gestation  3. Abnormal maternal serum screening test No preeclampsia or IUGR  Preterm labor symptoms and general obstetric precautions including but not limited to vaginal bleeding, contractions, leaking of fluid and fetal  movement were reviewed in detail with the patient.  I discussed the assessment and treatment plan with the patient. The patient was provided an opportunity to ask questions and all were answered. The patient agreed with the plan and demonstrated an understanding of the instructions. The patient was advised to call back or seek an in-person office evaluation/go to MAU at [facility] for any urgent or concerning symptoms. Please refer to After Visit Summary for other counseling recommendations.   I provided 11 minutes of non-face-to-face time during this encounter.  F/U 3 weeks following next [place]  Future Appointments  Date Time Provider Department Center  08/10/2019 10:30 AM [facility]-M[facility] NURSE WH-MFC MFC-US  08/10/2019 10:45 AM WH-MFC [place] 5 WH-MFCUS MFC-US  12/27/2019 11:00 AM Reather Littler, MD [facility] None    Scheryl Darter, MD Center for [facility]

## 2019-07-26 NOTE — Progress Notes (Signed)
Virtual ROB   CC: None  

## 2019-08-10 ENCOUNTER — Encounter (HOSPITAL_COMMUNITY): Payer: Self-pay

## 2019-08-10 ENCOUNTER — Other Ambulatory Visit: Payer: Self-pay

## 2019-08-10 ENCOUNTER — Ambulatory Visit (HOSPITAL_COMMUNITY)
Admission: RE | Admit: 2019-08-10 | Discharge: 2019-08-10 | Disposition: A | Discharge status: Home / Self Care | Payer: Medicaid Other | Source: Ambulatory Visit | Attending: Obstetrics and Gynecology | Admitting: Obstetrics and Gynecology

## 2019-08-10 ENCOUNTER — Ambulatory Visit (HOSPITAL_COMMUNITY): Payer: Medicaid Other | Admitting: *Deleted

## 2019-08-10 ENCOUNTER — Other Ambulatory Visit (HOSPITAL_COMMUNITY): Payer: Self-pay | Admitting: *Deleted

## 2019-08-10 VITALS — BP 119/81 | HR 79 | Temp 97.0°F

## 2019-08-10 DIAGNOSIS — Z3A33 33 weeks gestation of pregnancy: Secondary | ICD-10-CM

## 2019-08-10 DIAGNOSIS — O0990 Supervision of high risk pregnancy, unspecified, unspecified trimester: Secondary | ICD-10-CM

## 2019-08-10 DIAGNOSIS — O09293 Supervision of pregnancy with other poor reproductive or obstetric history, third trimester: Secondary | ICD-10-CM | POA: Diagnosis not present

## 2019-08-10 DIAGNOSIS — O99283 Endocrine, nutritional and metabolic diseases complicating pregnancy, third trimester: Secondary | ICD-10-CM

## 2019-08-10 DIAGNOSIS — Z362 Encounter for other antenatal screening follow-up: Secondary | ICD-10-CM

## 2019-08-10 DIAGNOSIS — E079 Disorder of thyroid, unspecified: Secondary | ICD-10-CM

## 2019-08-10 DIAGNOSIS — Z8759 Personal history of other complications of pregnancy, childbirth and the puerperium: Secondary | ICD-10-CM | POA: Diagnosis present

## 2019-08-16 ENCOUNTER — Other Ambulatory Visit: Payer: Self-pay

## 2019-08-16 ENCOUNTER — Ambulatory Visit (INDEPENDENT_AMBULATORY_CARE_PROVIDER_SITE_OTHER): Payer: Medicaid Other | Admitting: Obstetrics and Gynecology

## 2019-08-16 VITALS — BP 105/70 | HR 82 | Wt 134.0 lb

## 2019-08-16 DIAGNOSIS — Z3A34 34 weeks gestation of pregnancy: Secondary | ICD-10-CM

## 2019-08-16 DIAGNOSIS — O0990 Supervision of high risk pregnancy, unspecified, unspecified trimester: Secondary | ICD-10-CM

## 2019-08-16 DIAGNOSIS — O09293 Supervision of pregnancy with other poor reproductive or obstetric history, third trimester: Secondary | ICD-10-CM

## 2019-08-16 DIAGNOSIS — Z8759 Personal history of other complications of pregnancy, childbirth and the puerperium: Secondary | ICD-10-CM

## 2019-08-16 NOTE — Progress Notes (Signed)
   PRENATAL VISIT NOTE  Subjective:  Nicole Day is a 29 y.o. G2P1001 at [redacted]w[redacted]d being seen today for ongoing prenatal care.  She is currently monitored for the following issues for this low-risk pregnancy and has Supervision of high risk pregnancy, antepartum; History of prior pregnancy with IUGR newborn; Thyromegaly; Alpha thalassemia silent carrier; and Abnormal maternal serum screening test on their problem list.  Patient reports no complaints.  Contractions: Not present. Vag. Bleeding: None.  Movement: Present. Denies leaking of fluid.   The following portions of the patient's history were reviewed and updated as appropriate: allergies, current medications, past family history, past medical history, past social history, past surgical history and problem list.   Objective:   Vitals:   08/16/19 1304  BP: 105/70  Pulse: 82  Weight: 134 lb (60.8 kg)    Fetal Status: Fetal Heart Rate (bpm): 148 Fundal Height: 34 cm Movement: Present     General:  Alert, oriented and cooperative. Patient is in no acute distress.  Skin: Skin is warm and dry. No rash noted.   Cardiovascular: Normal heart rate noted  Respiratory: Normal respiratory effort, no problems with respiration noted  Abdomen: Soft, gravid, appropriate for gestational age.  Pain/Pressure: Present     Pelvic: Cervical exam deferred        Extremities: Normal range of motion.  Edema: None  Mental Status: Normal mood and affect. Normal behavior. Normal judgment and thought content.   Assessment and Plan:  Pregnancy: G2P1001 at [redacted]w[redacted]d 1. Supervision of high risk pregnancy, antepartum Patient is doing well without complaints Cultures next visit  2. History of prior pregnancy with IUGR newborn 2/24 EFW 2331gm 48%tile Follow up growth 3/18  Preterm labor symptoms and general obstetric precautions including but not limited to vaginal bleeding, contractions, leaking of fluid and fetal movement were reviewed in detail with the  patient. Please refer to After Visit Summary for other counseling recommendations.   Return for in person, Low risk, ROB, GBS.  Future Appointments  Date Time Provider Department Center  09/01/2019  9:30 AM [facility]-MFC NURSE WH-MFC MFC-US  09/01/2019  9:30 AM WH-MFC [place] 1 WH-MFCUS MFC-US  12/27/2019 11:00 AM Reather Littler, MD [facility] None    Catalina Antigua, MD

## 2019-08-30 ENCOUNTER — Encounter: Payer: Self-pay | Admitting: Advanced Practice Midwife

## 2019-08-30 ENCOUNTER — Ambulatory Visit (INDEPENDENT_AMBULATORY_CARE_PROVIDER_SITE_OTHER): Payer: Medicaid Other | Admitting: Advanced Practice Midwife

## 2019-08-30 ENCOUNTER — Other Ambulatory Visit (HOSPITAL_COMMUNITY)
Admission: RE | Admit: 2019-08-30 | Discharge: 2019-08-30 | Disposition: A | Discharge status: Home / Self Care | Payer: Medicaid Other | Source: Ambulatory Visit | Attending: Advanced Practice Midwife | Admitting: Advanced Practice Midwife

## 2019-08-30 ENCOUNTER — Other Ambulatory Visit: Payer: Self-pay

## 2019-08-30 VITALS — BP 131/85 | HR 87 | Wt 138.8 lb

## 2019-08-30 DIAGNOSIS — Z3A36 36 weeks gestation of pregnancy: Secondary | ICD-10-CM

## 2019-08-30 DIAGNOSIS — Z3403 Encounter for supervision of normal first pregnancy, third trimester: Secondary | ICD-10-CM

## 2019-08-30 DIAGNOSIS — Z8759 Personal history of other complications of pregnancy, childbirth and the puerperium: Secondary | ICD-10-CM

## 2019-08-30 NOTE — Patient Instructions (Signed)
Labor Precautions Reasons to come to MAU at [facility]:  1.  Contractions are  5 minutes apart or less, each last 1 minute, these have been going on for 1-2 hours, and you cannot walk or talk during them 2.  You have a large gush of fluid, or a trickle of fluid that will not stop and you have to wear a pad 3.  You have bleeding that is bright red, heavier than spotting--like menstrual bleeding (spotting can be normal in early labor or after a check of your cervix) 4.  You do not feel the baby moving like he/she normally does  

## 2019-08-30 NOTE — Progress Notes (Signed)
Pt presents for ROB c/o increased vaginal pressure, denies LOF/VB

## 2019-08-30 NOTE — Progress Notes (Signed)
   PRENATAL VISIT NOTE  Subjective:  Nicole Day is a 29 y.o. G2P1001 at 110w5d being seen today for ongoing prenatal care.  She is currently monitored for the following issues for this low-risk pregnancy and has Supervision of high risk pregnancy, antepartum; History of prior pregnancy with IUGR newborn; Thyromegaly; Alpha thalassemia silent carrier; and Abnormal maternal serum screening test on their problem list.  Patient reports occasional contractions.  Contractions: Not present. Vag. Bleeding: None.  Movement: Present. Denies leaking of fluid.   The following portions of the patient's history were reviewed and updated as appropriate: allergies, current medications, past family history, past medical history, past social history, past surgical history and problem list.   Objective:   Vitals:   08/30/19 1134  BP: 131/85  Pulse: 87  Weight: 138 lb 12.8 oz (63 kg)    Fetal Status: Fetal Heart Rate (bpm): 144 Fundal Height: 36 cm Movement: Present  Presentation: Vertex  General:  Alert, oriented and cooperative. Patient is in no acute distress.  Skin: Skin is warm and dry. No rash noted.   Cardiovascular: Normal heart rate noted  Respiratory: Normal respiratory effort, no problems with respiration noted  Abdomen: Soft, gravid, appropriate for gestational age.  Pain/Pressure: Present     Pelvic: Cervical exam performed Dilation: 1 Effacement (%): 50 Station: -2  Extremities: Normal range of motion.  Edema: None  Mental Status: Normal mood and affect. Normal behavior. Normal judgment and thought content.   Assessment and Plan:  Pregnancy: G2P1001 at [redacted]w[redacted]d 1. Encounter for supervision of normal first pregnancy in third trimester --Anticipatory guidance about next visits/weeks of pregnancy given. --Next visit in 1 week, virtual, then 1.5 weeks for 39 week visit in the office  2. History of prior pregnancy with IUGR newborn --[place] on 2/25 with EFW 48%tile, f/u US this week on  3/18  Term labor symptoms and general obstetric precautions including but not limited to vaginal bleeding, contractions, leaking of fluid and fetal movement were reviewed in detail with the patient. Please refer to After Visit Summary for other counseling recommendations.   Return in about 1 week (around 09/06/2019).  Future Appointments  Date Time Provider Department Center  09/01/2019  9:30 AM WH-MFC NURSE WH-MFC MFC-US  09/01/2019  9:30 AM [facility]-MFC [place] 1 WH-MFCUS MFC-US  12/27/2019 11:00 AM Reather Littler, MD [facility] None    Sharen Counter, CNM

## 2019-08-31 LAB — CERVICOVAGINAL ANCILLARY ONLY
Chlamydia: NEGATIVE
Comment: NEGATIVE
Comment: NORMAL
Neisseria Gonorrhea: NEGATIVE

## 2019-09-01 ENCOUNTER — Other Ambulatory Visit: Payer: Self-pay

## 2019-09-01 ENCOUNTER — Ambulatory Visit (HOSPITAL_COMMUNITY)
Admission: RE | Admit: 2019-09-01 | Discharge: 2019-09-01 | Disposition: A | Discharge status: Home / Self Care | Payer: Medicaid Other | Source: Ambulatory Visit | Attending: Obstetrics and Gynecology | Admitting: Obstetrics and Gynecology

## 2019-09-01 ENCOUNTER — Encounter (HOSPITAL_COMMUNITY): Payer: Self-pay

## 2019-09-01 ENCOUNTER — Ambulatory Visit (HOSPITAL_COMMUNITY): Payer: Medicaid Other | Admitting: *Deleted

## 2019-09-01 VITALS — BP 125/81 | HR 79 | Temp 97.5°F

## 2019-09-01 DIAGNOSIS — O0990 Supervision of high risk pregnancy, unspecified, unspecified trimester: Secondary | ICD-10-CM | POA: Insufficient documentation

## 2019-09-01 DIAGNOSIS — O289 Unspecified abnormal findings on antenatal screening of mother: Secondary | ICD-10-CM | POA: Diagnosis not present

## 2019-09-01 DIAGNOSIS — O99283 Endocrine, nutritional and metabolic diseases complicating pregnancy, third trimester: Secondary | ICD-10-CM

## 2019-09-01 DIAGNOSIS — O09293 Supervision of pregnancy with other poor reproductive or obstetric history, third trimester: Secondary | ICD-10-CM

## 2019-09-01 DIAGNOSIS — Z3A37 37 weeks gestation of pregnancy: Secondary | ICD-10-CM

## 2019-09-01 DIAGNOSIS — Z362 Encounter for other antenatal screening follow-up: Secondary | ICD-10-CM | POA: Insufficient documentation

## 2019-09-01 DIAGNOSIS — E079 Disorder of thyroid, unspecified: Secondary | ICD-10-CM

## 2019-09-01 DIAGNOSIS — Z8759 Personal history of other complications of pregnancy, childbirth and the puerperium: Secondary | ICD-10-CM

## 2019-09-03 LAB — CULTURE, BETA STREP (GROUP B ONLY): Strep Gp B Culture: NEGATIVE

## 2019-09-06 ENCOUNTER — Encounter: Payer: Self-pay | Admitting: Certified Nurse Midwife

## 2019-09-06 ENCOUNTER — Telehealth (INDEPENDENT_AMBULATORY_CARE_PROVIDER_SITE_OTHER): Payer: Medicaid Other | Admitting: Certified Nurse Midwife

## 2019-09-06 VITALS — BP 115/79 | HR 79

## 2019-09-06 DIAGNOSIS — O99282 Endocrine, nutritional and metabolic diseases complicating pregnancy, second trimester: Secondary | ICD-10-CM

## 2019-09-06 DIAGNOSIS — Z8759 Personal history of other complications of pregnancy, childbirth and the puerperium: Secondary | ICD-10-CM

## 2019-09-06 DIAGNOSIS — Z3A37 37 weeks gestation of pregnancy: Secondary | ICD-10-CM

## 2019-09-06 DIAGNOSIS — E010 Iodine-deficiency related diffuse (endemic) goiter: Secondary | ICD-10-CM

## 2019-09-06 DIAGNOSIS — O0990 Supervision of high risk pregnancy, unspecified, unspecified trimester: Secondary | ICD-10-CM

## 2019-09-06 NOTE — Progress Notes (Signed)
Virtual Visit via Telephone Note  I connected with Nicole Day on 09/06/19 at 10:35 AM EDT by telephone and verified that I am speaking with the correct person using two identifiers.  Pt c/o increased vaginal pressure addressed last week, denies LOF/VB.

## 2019-09-06 NOTE — Progress Notes (Signed)
   Induction Assessment Scheduling Form: Fax to [facility]:  [phone]  Nicole Day                                                                                  DOB:  1990/10/25                                                            MRN:  [id number]                                                                     Phone #: [phone]                     Provider:  [facility]  (Faculty Practice)  GP: [id number]                                                            Estimated Date of Delivery: 09/26/19  Dating Criteria: LMP    Medical Indications for induction:  Hx of IUGR, post dates  Admission Date/Time:  09/26/19 @ [facility] Gestational age on admission:  40.4   There were no vitals filed for this visit.  HIV:  Non Reactive (01/12 0947) GBS: Negative/-- (03/16 1141)   Method of induction(proposed):  cytotec   Scheduling Provider Signature:  Sharyon Cable, CNM                                            Today's Date:  09/06/2019

## 2019-09-06 NOTE — Progress Notes (Signed)
OBSTETRICS PRENATAL VIRTUAL VISIT ENCOUNTER NOTE  Provider location: [facility] [place]   I connected with Nicole Day on 09/06/19 at 10:45 AM EDT by MyChart Video Encounter at home and verified that I am speaking with the correct person using two identifiers.   I discussed the limitations, risks, security and privacy concerns of performing an evaluation and management service virtually and the availability of in person appointments. I also discussed with the patient that there may be a patient responsible charge related to this service. The patient expressed understanding and agreed to proceed. Subjective:  Nicole Day is a 29 y.o. G2P1001 at [redacted]w[redacted]d being seen today for ongoing prenatal care.  She is currently monitored for the following issues for this low-risk pregnancy and has Supervision of high risk pregnancy, antepartum; History of prior pregnancy with IUGR newborn; Thyromegaly; Alpha thalassemia silent carrier; and Abnormal maternal serum screening test on their problem list.  Patient reports occasional contractions and pelvic pressure.  Contractions: Not present. Vag. Bleeding: None.  Movement: Present. Denies any leaking of fluid.   The following portions of the patient's history were reviewed and updated as appropriate: allergies, current medications, past family history, past medical history, past social history, past surgical history and problem list.   Objective:   Vitals:   09/06/19 1033  BP: 115/79  Pulse: 79    Fetal Status:     Movement: Present     General:  Alert, oriented and cooperative. Patient is in no acute distress.  Respiratory: Normal respiratory effort, no problems with respiration noted  Mental Status: Normal mood and affect. Normal behavior. Normal judgment and thought content.  Rest of physical exam deferred due to type of encounter  Imaging: [place] MFM OB FOLLOW UP  Result Date:  09/01/2019 ----------------------------------------------------------------------  OBSTETRICS REPORT                       (Signed Final 09/01/2019 10:55 am) ---------------------------------------------------------------------- Patient Info  ID #:       [id number]                          D.O.B.:  [birth year] (29 yrs)  Name:       Nicole Day                 Visit Date: 09/01/2019 09:49 am ---------------------------------------------------------------------- Performed By  Performed By:     Sandi Mealy        Ref. Address:     [address]                                                             [phone]  [place]                                                             [address]  Attending:        Ma Rings MD         [facility]  Referred By:      [facility] ---------------------------------------------------------------------- Orders   #  Description                          Code         Ordered By   1  [place] MFM OB FOLLOW UP                  10272.53     Lin Landsman  ----------------------------------------------------------------------   #  Order #                    Accession #                 Episode #   1  [id number]                  [id number]                  [id number]  ---------------------------------------------------------------------- Indications   Poor obstetric history: Previous fetal growth  O09.299   restriction (FGR)   Encounter for other antenatal screening        Z36.2   follow-up (low risk NIPS)   Thyroid disease in pregnancy (thryomegaly)     O99.280, E07.9   (nl TSH level 10/1)   Abnormal biochemical screen (silent alpha      O28.9   thal carrier, increased risk for SMA)   [redacted]  weeks gestation of pregnancy                Z3A.37  ---------------------------------------------------------------------- Fetal Evaluation  Num Of Fetuses:         1  Fetal Heart Rate(bpm):  144  Cardiac Activity:       Observed  Presentation:           Cephalic  Placenta:               Posterior  P. Cord Insertion:      Visualized  Amniotic Fluid  AFI FV:      Within normal limits  AFI Sum(cm)     %Tile       Largest Pocket(cm)  13.81  51          4.44  RUQ(cm)       RLQ(cm)       LUQ(cm)        LLQ(cm)  3.32          4.44          1.91           4.14 ---------------------------------------------------------------------- Biometry  BPD:        85  mm     G. Age:  34w 2d          5  %    CI:        70.85   %    70 - 86                                                          FL/HC:      21.5   %    20.8 - 22.6  HC:      321.8  mm     G. Age:  36w 2d         12  %    HC/AC:      1.02        0.92 - 1.05  AC:      314.2  mm     G. Age:  35w 2d         19  %    FL/BPD:     81.5   %    71 - 87  FL:       69.3  mm     G. Age:  35w 4d         16  %    FL/AC:      22.1   %    20 - 24  HUM:      62.8  mm     G. Age:  36w 3d         59  %  Est. FW:    2667  gm    5 lb 14 oz      17  % ---------------------------------------------------------------------- OB History  Gravidity:    2         Term:   1        Prem:   0        SAB:   0  TOP:          0       Ectopic:  0        Living: 1 ---------------------------------------------------------------------- Gestational Age  LMP:           35w 1d        Date:  12/29/18                 EDD:   10/05/19  U/S Today:     35w 3d                                        EDD:   10/03/19  Best:          37w 0d     Det. By:  U/S  (05/09/19)  EDD:   09/22/19 ---------------------------------------------------------------------- Anatomy  Cranium:               Appears normal         Aortic Arch:            Previously seen  Cavum:                 Appears normal         Ductal  Arch:            Previously seen  Ventricles:            Previously seen        Diaphragm:              Appears normal  Choroid Plexus:        Previously seen        Stomach:                Appears normal, left                                                                        sided  Cerebellum:            Previously seen        Abdomen:                Appears normal  Posterior Fossa:       Previously seen        Abdominal Wall:         Previously seen  Nuchal Fold:           Not applicable (>20    Cord Vessels:           Previously seen                         wks GA)  Face:                  Orbits and profile     Kidneys:                Appear normal                         previously seen  Lips:                  Previously seen        Bladder:                Appears normal  Thoracic:              Appears normal         Spine:                  Previously seen  Heart:                 Previously seen        Upper Extremities:      Previously seen  RVOT:                  Previously seen        Lower Extremities:  Previously seen  LVOT:                  Previously seen ---------------------------------------------------------------------- Doppler - Fetal Vessels  Umbilical Artery   S/D     %tile                                            ADFV    RDFV  2.09       32                                                No      No ---------------------------------------------------------------------- Comments  This patient was seen for a follow up growth scan due to a  history of a prior IUGR baby.  She denies any problems since  her last exam and reports feeling vigorous fetal movements  throughout the day.  She was informed that the fetal growth and amniotic fluid  level appears appropriate for her gestational age.  Fetal movements and fetal breathing movements were noted  throughout today's exam.  As the fetal growth is within normal limits, no further exams  were scheduled in our office.  ----------------------------------------------------------------------                   Ma Rings, MD Electronically Signed Final Report   09/01/2019 10:55 am ----------------------------------------------------------------------  [place] MFM OB FOLLOW UP  Result Date: 08/11/2019 ----------------------------------------------------------------------  OBSTETRICS REPORT                        (Signed Final 08/11/2019 06:54 am) ---------------------------------------------------------------------- Patient Info  ID #:       [id number]                          D.O.B.:  April 23, 1991 (28 yrs)  Name:       Nicole Day                 Visit Date: 08/10/2019 11:40 am ---------------------------------------------------------------------- Performed By  Performed By:     Emeline Darling BS,      Ref. Address:      [address]                                                              [address] [phone]  [place]                                                              [address]  Attending:        Lin Landsman      [facility]                    MD                                        [facility]  Referred By:      [facility] ---------------------------------------------------------------------- Orders   #  Description                          Code         Ordered By   1  [place] MFM OB FOLLOW UP                  [phone]     YU FANG  ----------------------------------------------------------------------   #  Order #                    Accession #                 Episode #   1  [id number]                  [id number]                  [id number]  ---------------------------------------------------------------------- Indications   Poor obstetric history: Previous fetal growth  O09.299   restriction (FGR)   Encounter for other antenatal screening        Z36.2    follow-up (low risk NIPS)   Thyroid disease in pregnancy (thryomegaly)     O99.280, E07.9   (nl TSH level 10/1)   Abnormal biochemical screen (silent alpha      O28.9   thal carrier, increased risk for SMA)   [redacted] weeks gestation of pregnancy                Z3A.33  ---------------------------------------------------------------------- Fetal Evaluation  Num Of Fetuses:          1  Fetal Heart Rate(bpm):   151  Cardiac Activity:        Observed  Presentation:            Cephalic  Placenta:                Posterior  P. Cord Insertion:       Previously Visualized  Amniotic Fluid  AFI FV:      Within normal limits  AFI Sum(cm)     %Tile       Largest Pocket(cm)  14.38           51          5.95  RUQ(cm)       RLQ(cm)       LUQ(cm)        LLQ(cm)  5.95          1.69  3.61           3.13 ---------------------------------------------------------------------- Biometry  BPD:        85  mm     G. Age:  34w 2d         59  %    CI:        74.08   %    70 - 86                                                          FL/HC:       20.6  %    19.4 - 21.8  HC:      313.6  mm     G. Age:  35w 1d         47  %    HC/AC:       1.04       0.96 - 1.11  AC:      301.6  mm     G. Age:  34w 1d         61  %    FL/BPD:      76.0  %    71 - 87  FL:       64.6  mm     G. Age:  33w 2d         26  %    FL/AC:       21.4  %    20 - 24  Est. FW:    2331   gm     5 lb 2 oz     48  % ---------------------------------------------------------------------- OB History  Gravidity:    2         Term:   1        Prem:   0        SAB:   0  TOP:          0       Ectopic:  0        Living: 1 ---------------------------------------------------------------------- Gestational Age  LMP:           32w 0d        Date:  12/29/18                 EDD:   10/05/19  U/S Today:     34w 2d                                        EDD:   09/19/19  Best:          33w 6d     Det. By:  U/S  (05/09/19)          EDD:   09/22/19  ---------------------------------------------------------------------- Anatomy  Cranium:               Appears normal         Aortic Arch:            Previously seen  Cavum:                 Previously seen        Ductal Arch:  Previously seen  Ventricles:            Appears normal         Diaphragm:              Appears normal  Choroid Plexus:        Previously seen        Stomach:                Appears normal, left                                                                        sided  Cerebellum:            Previously seen        Abdomen:                Appears normal  Posterior Fossa:       Previously seen        Abdominal Wall:         Previously seen  Nuchal Fold:           Not applicable (>67    Cord Vessels:           Previously seen                         wks GA)  Face:                  Orbits and profile     Kidneys:                Appear normal                         previously seen  Lips:                  Appears normal         Bladder:                Appears normal  Thoracic:              Appears normal         Spine:                  Previously seen  Heart:                 Appears normal         Upper Extremities:      Previously seen                         (4CH, axis, and                         situs)  RVOT:                  Previously seen        Lower Extremities:      Previously seen  LVOT:                  Previously seen  Other:  Heels and 5th digit visualized previously. Technically difficult  due to          fetal position. ---------------------------------------------------------------------- Cervix Uterus Adnexa  Cervix  Not visualized (advanced GA >24wks) ---------------------------------------------------------------------- Impression  Normal interval growth  Good fetal movement and amniotic fluid  Prior history of IUGR ---------------------------------------------------------------------- Recommendations  Follow up growth in 3 weeks given history of IUGR.  ----------------------------------------------------------------------               Lin Landsman, MD Electronically Signed Final Report   08/11/2019 06:54 am ----------------------------------------------------------------------   Assessment and Plan:  Pregnancy: G2P1001 at [redacted]w[redacted]d 1. Supervision of high risk pregnancy, antepartum - patient doing well, reports occasional tightening that are not painful and pelvic pressure with walking and movement  - Discussed with patient normalcy at this stage of pregnancy, labor precautions reviewed and reasons to go to MAU for evaluation discussed  - routine prenatal care - anticipatory guidance on upcoming appointments  - Discussed with patient IOL will be scheduled for after 40 weeks based on hx of IUGR - Orders for IOL placed   2. Thyromegaly  3. History of prior pregnancy with IUGR newborn - hx of IUGR in previous pregnancy - normal follow up US on 3/18,  Est. FW: 2667gm 5 lb 14 oz  17  % and AFI normal based on [place]  - per MFM no further ultrasounds needed   Term labor symptoms and general obstetric precautions including but not limited to vaginal bleeding, contractions, leaking of fluid and fetal movement were reviewed in detail with the patient. I discussed the assessment and treatment plan with the patient. The patient was provided an opportunity to ask questions and all were answered. The patient agreed with the plan and demonstrated an understanding of the instructions. The patient was advised to call back or seek an in-person office evaluation/go to [facility]U at [facility] for any urgent or concerning symptoms. Please refer to After Visit Summary for other counseling recommendations.   I provided 10 minutes of face-to-face time during this encounter.   Future Appointments  Date Time Provider Department Center  09/15/2019 11:15 AM Currie Paris, NP [facility] None  12/27/2019 11:00 AM Reather Littler, MD [facility] None     Sharyon Cable, CNM [facility] [employer], [facility]

## 2019-09-10 ENCOUNTER — Other Ambulatory Visit: Payer: Self-pay | Admitting: Family Medicine

## 2019-09-13 ENCOUNTER — Telehealth (HOSPITAL_COMMUNITY): Payer: Self-pay | Admitting: *Deleted

## 2019-09-13 NOTE — Telephone Encounter (Signed)
Preadmission screen  

## 2019-09-14 ENCOUNTER — Telehealth (HOSPITAL_COMMUNITY): Payer: Self-pay | Admitting: *Deleted

## 2019-09-14 NOTE — Telephone Encounter (Signed)
Preadmission screen  

## 2019-09-15 ENCOUNTER — Encounter (HOSPITAL_COMMUNITY): Payer: Self-pay | Admitting: *Deleted

## 2019-09-15 ENCOUNTER — Encounter: Payer: Self-pay | Admitting: Nurse Practitioner

## 2019-09-15 ENCOUNTER — Other Ambulatory Visit: Payer: Self-pay

## 2019-09-15 ENCOUNTER — Ambulatory Visit (INDEPENDENT_AMBULATORY_CARE_PROVIDER_SITE_OTHER): Payer: Medicaid Other | Admitting: Nurse Practitioner

## 2019-09-15 DIAGNOSIS — O09893 Supervision of other high risk pregnancies, third trimester: Secondary | ICD-10-CM | POA: Diagnosis not present

## 2019-09-15 DIAGNOSIS — Z3A39 39 weeks gestation of pregnancy: Secondary | ICD-10-CM | POA: Diagnosis not present

## 2019-09-15 DIAGNOSIS — O0993 Supervision of high risk pregnancy, unspecified, third trimester: Secondary | ICD-10-CM

## 2019-09-15 DIAGNOSIS — O0990 Supervision of high risk pregnancy, unspecified, unspecified trimester: Secondary | ICD-10-CM

## 2019-09-15 NOTE — Patient Instructions (Signed)

## 2019-09-15 NOTE — Progress Notes (Signed)
ROB  CC: None   Pt would like cervix check.

## 2019-09-15 NOTE — Telephone Encounter (Signed)
Preadmission screen  

## 2019-09-15 NOTE — Progress Notes (Signed)
    Subjective:  Nicole Day is a 29 y.o. G2P1001 at [redacted]w[redacted]d being seen today for ongoing prenatal care.  She is currently monitored for the following issues for this high-risk pregnancy and has Supervision of high risk pregnancy, antepartum; History of prior pregnancy with IUGR newborn; Thyromegaly; Alpha thalassemia silent carrier; and Abnormal maternal serum screening test on their problem list.  Patient reports occasional contractions.  Contractions: Irritability. Vag. Bleeding: None.  Movement: Present. Denies leaking of fluid.   The following portions of the patient's history were reviewed and updated as appropriate: allergies, current medications, past family history, past medical history, past social history, past surgical history and problem list. Problem list updated.  Objective:   Vitals:   09/15/19 1128  BP: 121/82  Pulse: 78  Weight: 136 lb (61.7 kg)    Fetal Status: Fetal Heart Rate (bpm): 140 Fundal Height: 38 cm Movement: Present  Presentation: Vertex  General:  Alert, oriented and cooperative. Patient is in no acute distress.  Skin: Skin is warm and dry. No rash noted.   Cardiovascular: Normal heart rate noted  Respiratory: Normal respiratory effort, no problems with respiration noted  Abdomen: Soft, gravid, appropriate for gestational age. Pain/Pressure: Present     Pelvic:  Cervical exam performed Dilation: 1 Effacement (%): 50 Station: -2  Extremities: Normal range of motion.  Edema: Trace  Mental Status: Normal mood and affect. Normal behavior. Normal judgment and thought content.   Urinalysis:      Assessment and Plan:  Pregnancy: G2P1001 at [redacted]w[redacted]d  1. Supervision of high risk pregnancy, antepartum Cervix unchanged Scheduled for induction Advised to enter BPs into Babyscripts - none in March entered Plans to have pediatrician in [place] Plans to move and wants to have her baby at [facility] - Advised to call to schedule with MD but likely no one will accept  a transfer this late in pregnancy. Advised if she starts labor spontaneously, she can go to [place].  Explained all records are on Epic or provider can get records from [facility] 24/7.  Term labor symptoms and general obstetric precautions including but not limited to vaginal bleeding, contractions, leaking of fluid and fetal movement were reviewed in detail with the patient. Please refer to After Visit Summary for other counseling recommendations.  Return in about 1 week (around 09/22/2019) for in person visit and NST.  Nolene Bernheim, RN, MSN, NP-BC Nurse Practitioner, [facility] for [employer], [facility] 09/15/2019 11:57 AM

## 2019-09-22 ENCOUNTER — Encounter: Payer: Medicaid Other | Admitting: Obstetrics & Gynecology

## 2019-09-24 ENCOUNTER — Other Ambulatory Visit (HOSPITAL_COMMUNITY): Admission: RE | Admit: 2019-09-24 | Payer: Medicaid Other | Source: Ambulatory Visit

## 2019-09-25 MED ORDER — TETANUS-DIPHTH-ACELL PERTUSSIS 5-2.5-18.5 LF-MCG/0.5 IM SUSP
0.50 | INTRAMUSCULAR | Status: DC
Start: ? — End: 2019-09-25

## 2019-09-25 MED ORDER — DIPHENHYDRAMINE HCL 25 MG PO CAPS
25.00 | ORAL_CAPSULE | ORAL | Status: DC
Start: ? — End: 2019-09-25

## 2019-09-25 MED ORDER — MAGNESIUM HYDROXIDE 400 MG/5ML PO SUSP
30.00 | ORAL | Status: DC
Start: ? — End: 2019-09-25

## 2019-09-25 MED ORDER — GENERIC EXTERNAL MEDICATION
600.00 | Status: DC
Start: 2019-09-24 — End: 2019-09-25

## 2019-09-25 MED ORDER — BENZOCAINE-MENTHOL 20-0.5 % EX AERO
INHALATION_SPRAY | CUTANEOUS | Status: DC
Start: ? — End: 2019-09-25

## 2019-09-25 MED ORDER — GENERIC EXTERNAL MEDICATION
Status: DC
Start: ? — End: 2019-09-25

## 2019-09-25 MED ORDER — WITCH HAZEL-GLYCERIN EX PADS
1.00 | MEDICATED_PAD | CUTANEOUS | Status: DC
Start: ? — End: 2019-09-25

## 2019-09-25 MED ORDER — ACETAMINOPHEN 325 MG PO TABS
650.00 | ORAL_TABLET | ORAL | Status: DC
Start: ? — End: 2019-09-25

## 2019-09-25 MED ORDER — BISACODYL 10 MG RE SUPP
10.00 | RECTAL | Status: DC
Start: ? — End: 2019-09-25

## 2019-09-25 MED ORDER — VARICELLA VIRUS VACCINE LIVE 1350 PFU/0.5ML ~~LOC~~ INJ
0.50 | INJECTION | SUBCUTANEOUS | Status: DC
Start: ? — End: 2019-09-25

## 2019-09-25 MED ORDER — PRENATAL 28-0.8 MG PO TABS
1.00 | ORAL_TABLET | ORAL | Status: DC
Start: 2019-09-24 — End: 2019-09-25

## 2019-09-25 MED ORDER — SODIUM CHLORIDE FLUSH 0.9 % IV SOLN
10.00 | INTRAVENOUS | Status: DC
Start: 2019-09-23 — End: 2019-09-25

## 2019-09-25 MED ORDER — DSS 100 MG PO CAPS
100.00 | ORAL_CAPSULE | ORAL | Status: DC
Start: ? — End: 2019-09-25

## 2019-09-25 MED ORDER — ALUM & MAG HYDROXIDE-SIMETH 200-200-20 MG/5ML PO SUSP
30.00 | ORAL | Status: DC
Start: ? — End: 2019-09-25

## 2019-09-25 MED ORDER — SIMETHICONE 80 MG PO CHEW
80.00 | CHEWABLE_TABLET | ORAL | Status: DC
Start: ? — End: 2019-09-25

## 2019-09-25 MED ORDER — SODIUM CHLORIDE FLUSH 0.9 % IV SOLN
10.00 | INTRAVENOUS | Status: DC
Start: ? — End: 2019-09-25

## 2019-09-25 MED ORDER — MEASLES, MUMPS & RUBELLA VAC IJ SOLR
0.50 | INTRAMUSCULAR | Status: DC
Start: ? — End: 2019-09-25

## 2019-09-25 MED ORDER — ENEMA 7-19 GM/118ML RE ENEM
1.00 | ENEMA | RECTAL | Status: DC
Start: ? — End: 2019-09-25

## 2019-09-26 ENCOUNTER — Inpatient Hospital Stay (HOSPITAL_COMMUNITY): Payer: Medicaid Other

## 2019-09-26 ENCOUNTER — Inpatient Hospital Stay (HOSPITAL_COMMUNITY)
Admission: AD | Admit: 2019-09-26 | Payer: Medicaid Other | Source: Home / Self Care | Admitting: Obstetrics & Gynecology

## 2019-12-27 ENCOUNTER — Ambulatory Visit: Payer: Medicaid Other | Admitting: Endocrinology

## 2021-10-02 IMAGING — US US MFM OB FOLLOW-UP
1 series · 14 of 28 positions shown · non-contrast
Comparison: none

[Series 1: us mfm ob follow-up · 47 acquisitions, 14 frames shown]
[im 2/47]
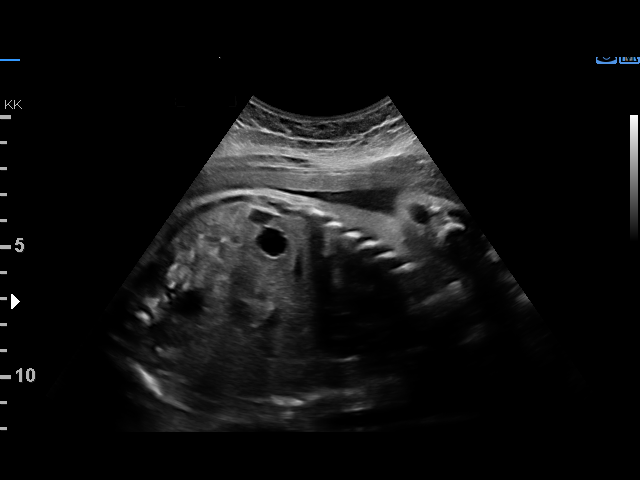
[im 6/47]
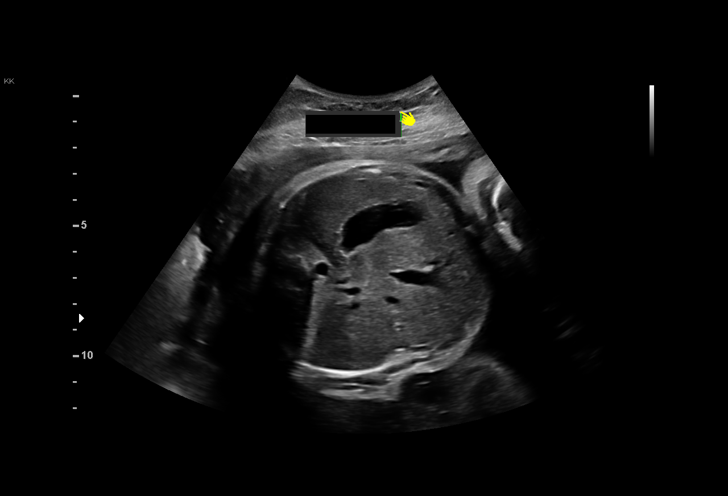
[im 9/47]
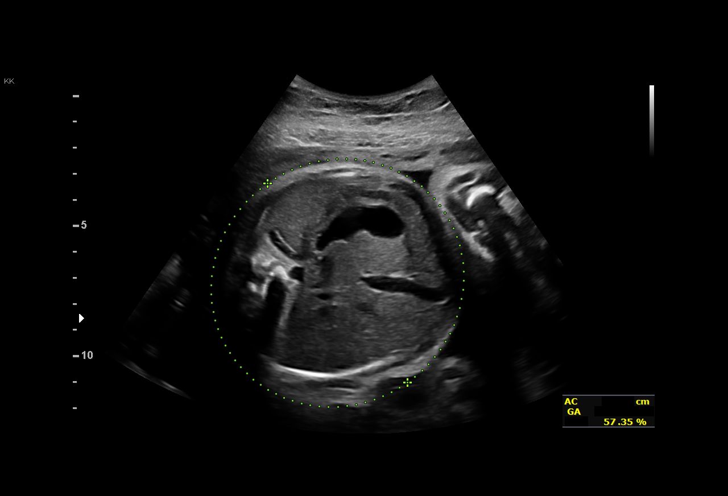
[im 12/47]
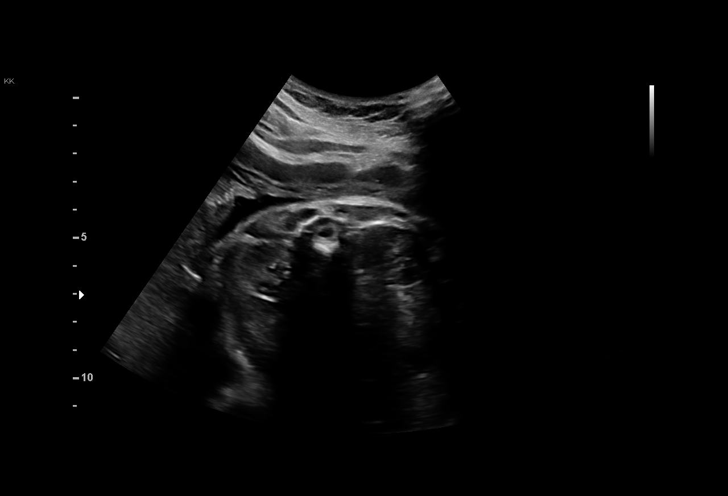
[im 16/47]
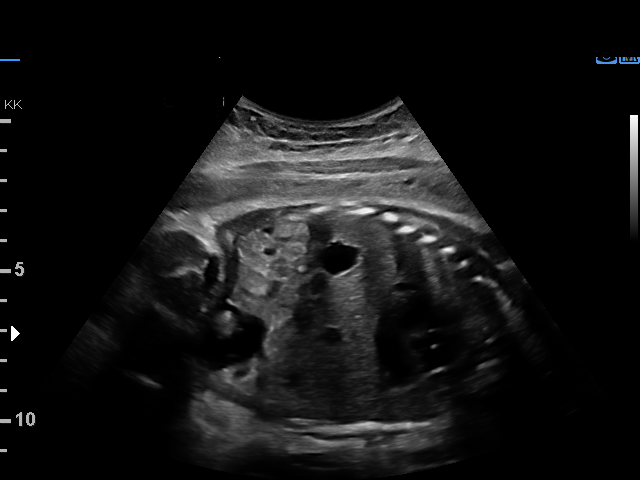
[im 19/47]
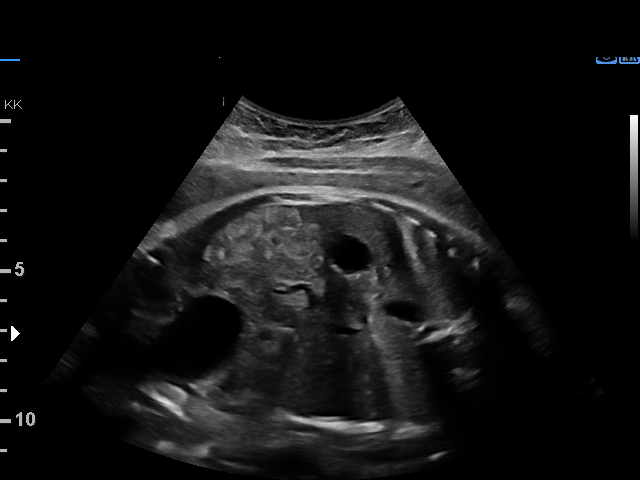
[im 23/47]
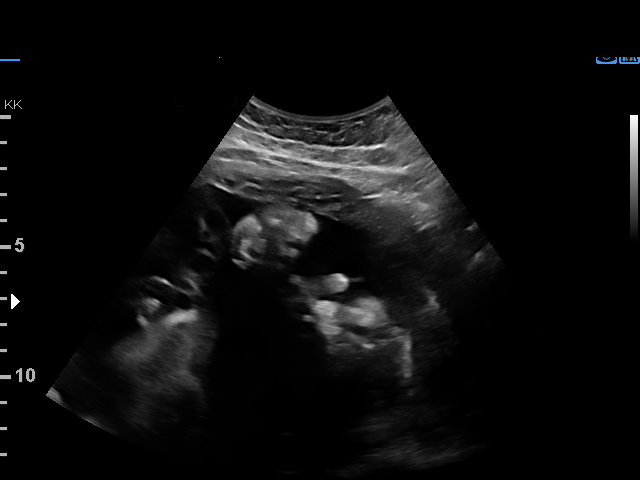
[im 26/47]
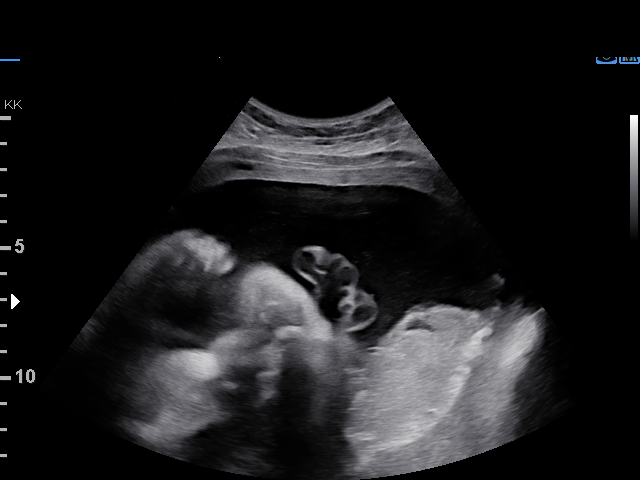
[im 29/47]
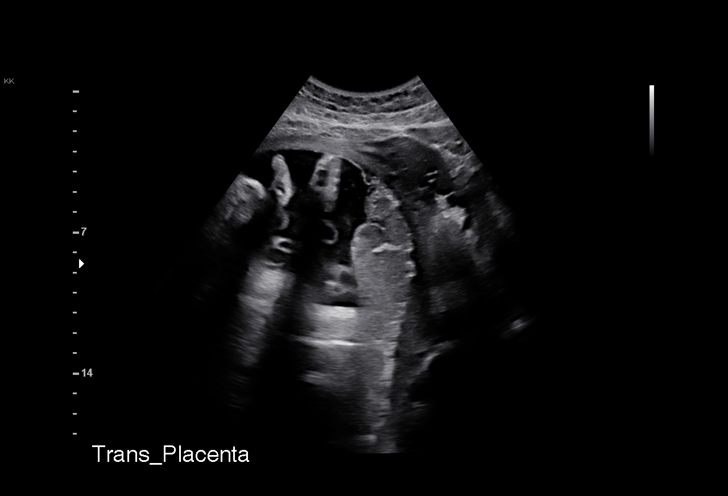
[im 33/47]
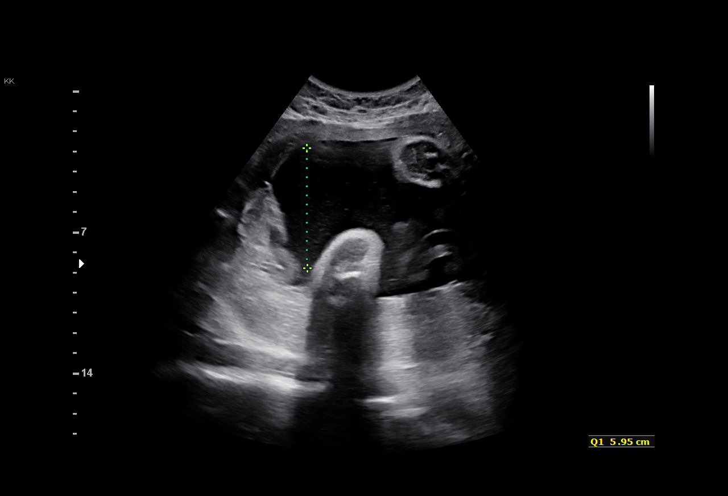
[im 36/47]
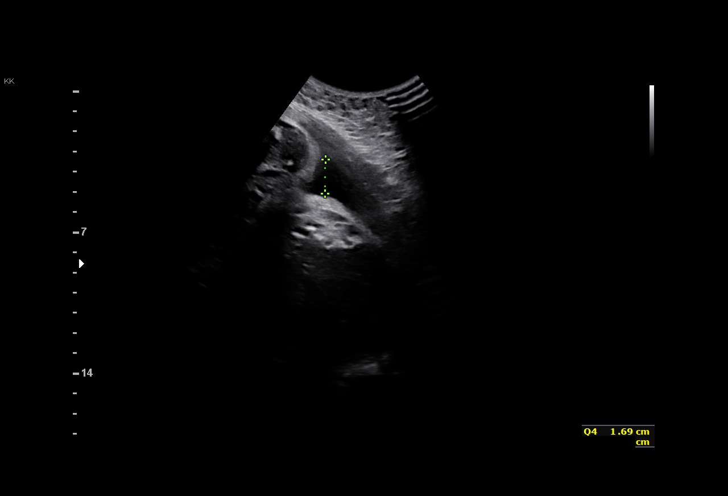
[im 40/47]
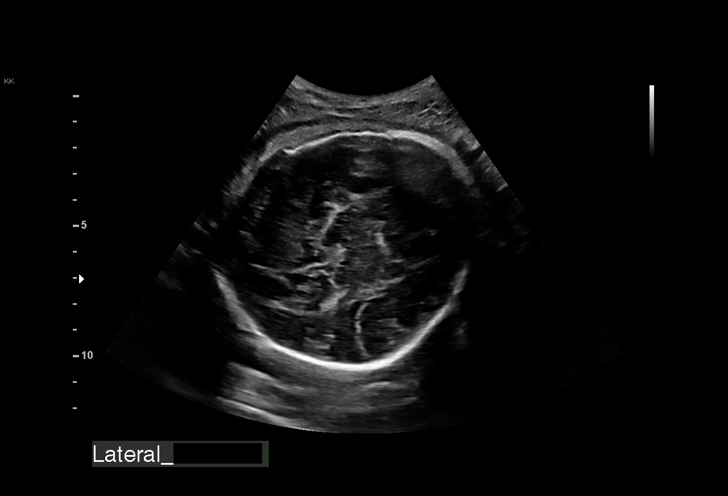
[im 43/47]
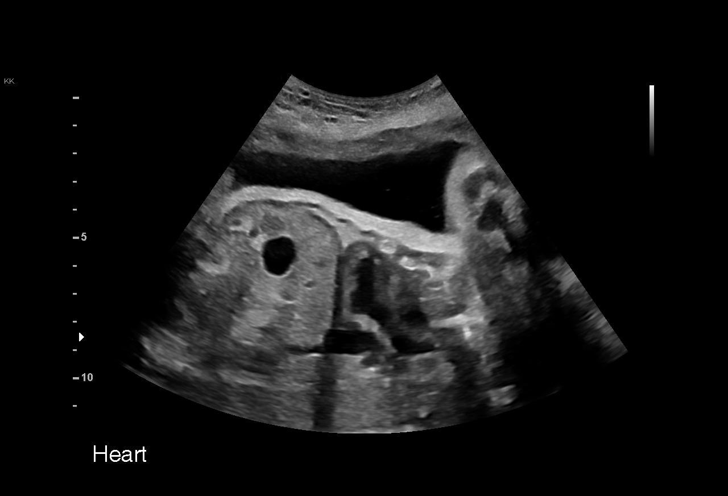
[im 47/47]
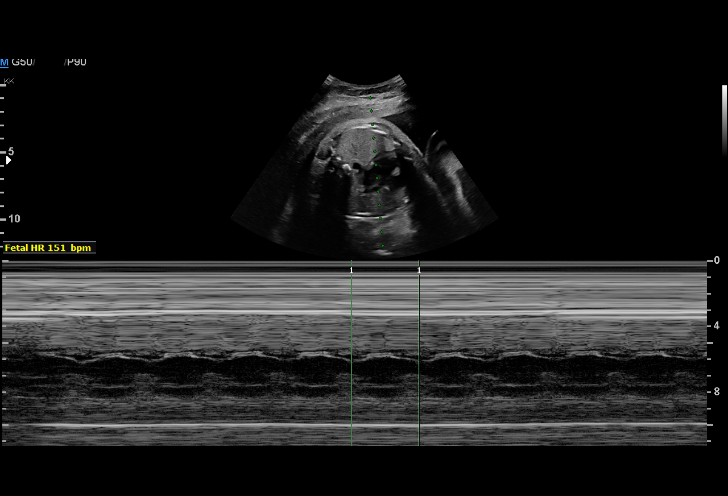

[14 of 28 positions shown; findings below may reference images not displayed]

----------------------------------------------------------------------

 ----------------------------------------------------------------------
Indications

  Poor obstetric history: Previous fetal growth
  restriction (FGR)
  Encounter for other antenatal screening
  follow-up (low risk NIPS)
  Thyroid disease in pregnancy (thryomegaly)     O99.280,
  (nl TSH level [DATE])
  Abnormal biochemical screen (silent alpha
  Nana Adu Batuuro, increased risk for SMA)
  33 weeks gestation of pregnancy
 ----------------------------------------------------------------------
Fetal Evaluation

 Num Of Fetuses:          1
 Fetal Heart Rate(bpm):   151
 Cardiac Activity:        Observed
 Presentation:            Cephalic
 Placenta:                Posterior
 P. Cord Insertion:       Previously Visualized

 Amniotic Fluid
 AFI FV:      Within normal limits

 AFI Sum(cm)     %Tile       Largest Pocket(cm)
 14.38           51

 RUQ(cm)       RLQ(cm)       LUQ(cm)        LLQ(cm)

Biometry

 BPD:        85  mm     G. Age:  34w 2d         59  %    CI:        74.08   %    70 - 86
                                                         FL/HC:       20.6  %    19.4 -
 HC:      313.6  mm     G. Age:  35w 1d         47  %    HC/AC:       1.04       0.96 -
 AC:      301.6  mm     G. Age:  34w 1d         61  %    FL/BPD:      76.0  %    71 - 87
 FL:       64.6  mm     G. Age:  33w 2d         26  %    FL/AC:       21.4  %    20 - 24

 Est. FW:    0007   gm     5 lb 2 oz     48  %
OB History

 Gravidity:    2         Term:   1        Prem:   0        SAB:   0
 TOP:          0       Ectopic:  0        Living: 1
Gestational Age

 LMP:           32w 0d        Date:  12/29/18                 EDD:   10/05/19
 U/S Today:     34w 2d                                        EDD:   09/19/19
 Best:          33w 6d     Det. By:  U/S  (05/09/19)          EDD:   09/22/19
Anatomy

 Cranium:               Appears normal         Aortic Arch:            Previously seen
 Cavum:                 Previously seen        Ductal Arch:            Previously seen
 Ventricles:            Appears normal         Diaphragm:              Appears normal
 Choroid Plexus:        Previously seen        Stomach:                Appears normal, left
                                                                       sided
 Cerebellum:            Previously seen        Abdomen:                Appears normal
 Posterior Fossa:       Previously seen        Abdominal Wall:         Previously seen
 Nuchal Fold:           Not applicable (>20    Cord Vessels:           Previously seen
                        wks GA)
 Face:                  Orbits and profile     Kidneys:                Appear normal
                        previously seen
 Lips:                  Appears normal         Bladder:                Appears normal
 Thoracic:              Appears normal         Spine:                  Previously seen
 Heart:                 Appears normal         Upper Extremities:      Previously seen
                        (4CH, axis, and
                        situs)
 RVOT:                  Previously seen        Lower Extremities:      Previously seen
 LVOT:                  Previously seen

 Other:  Heels and 5th digit visualized previously. Technically difficult due to
         fetal position.
Cervix Uterus Adnexa

 Cervix
 Not visualized (advanced GA >62wks)
Impression

 Normal interval growth
 Good fetal movement and amniotic fluid
 Prior history of IUGR
Recommendations

 Follow up growth in 3 weeks given history of IUGR.

## 2021-10-24 IMAGING — US US MFM OB FOLLOW-UP
1 series · 14 of 28 positions shown · non-contrast
Comparison: none

[Series 1: us mfm ob follow-up · 43 acquisitions, 14 frames shown]
[im 2/43]
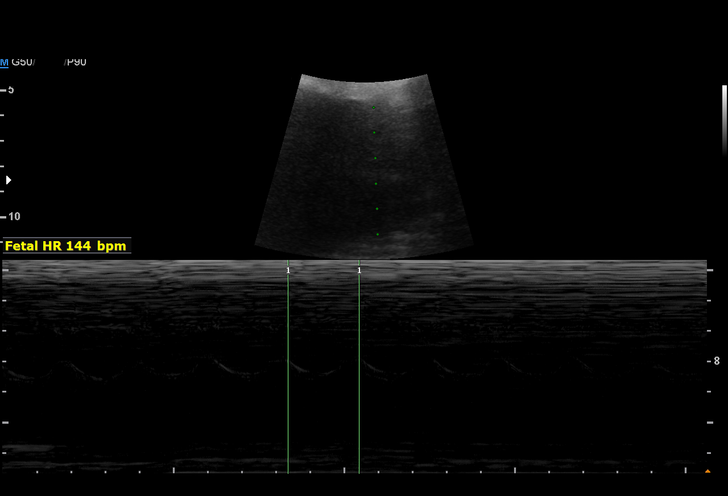
[im 5/43]
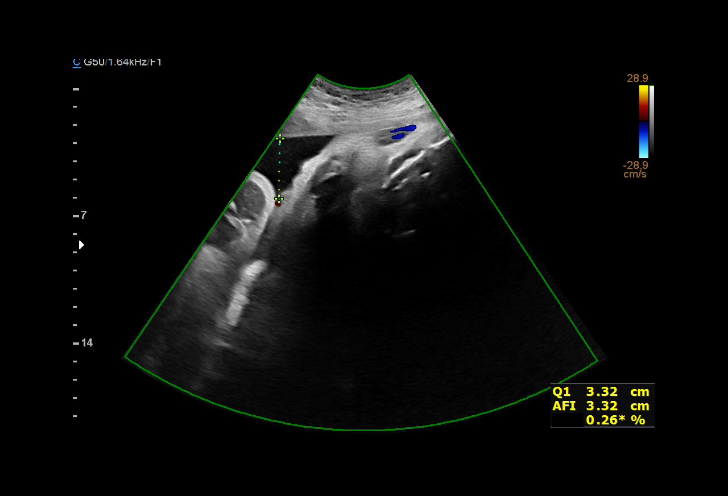
[im 8/43]
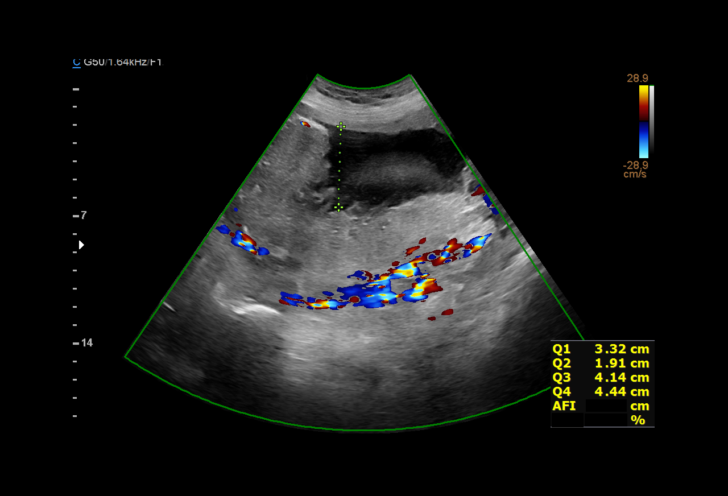
[im 11/43]
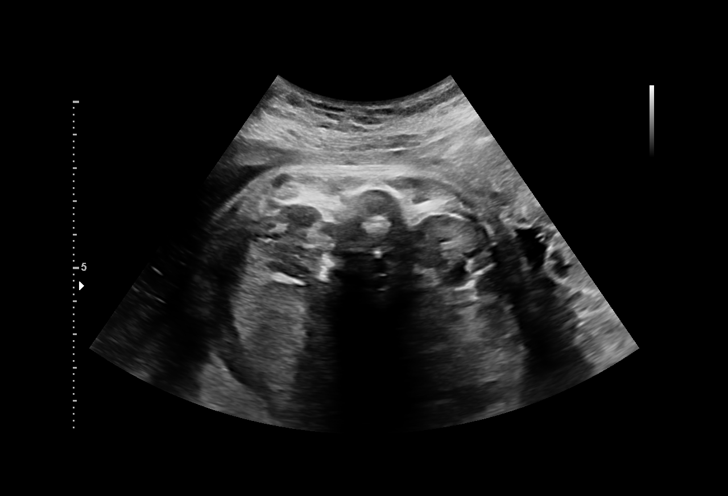
[im 15/43]
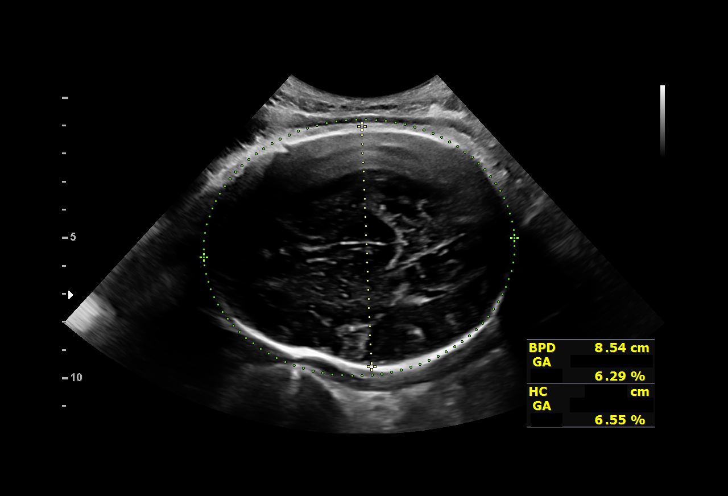
[im 18/43]
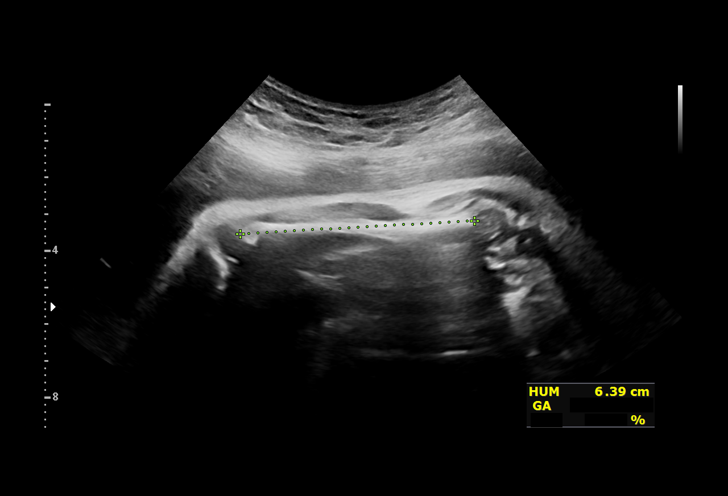
[im 21/43]
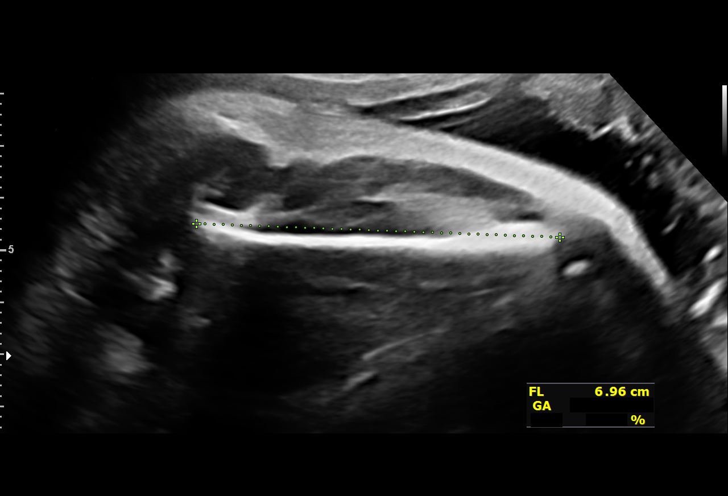
[im 24/43]
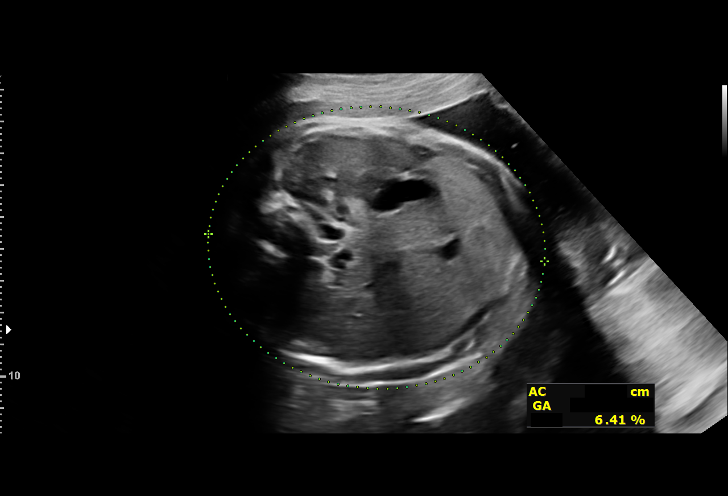
[im 27/43]
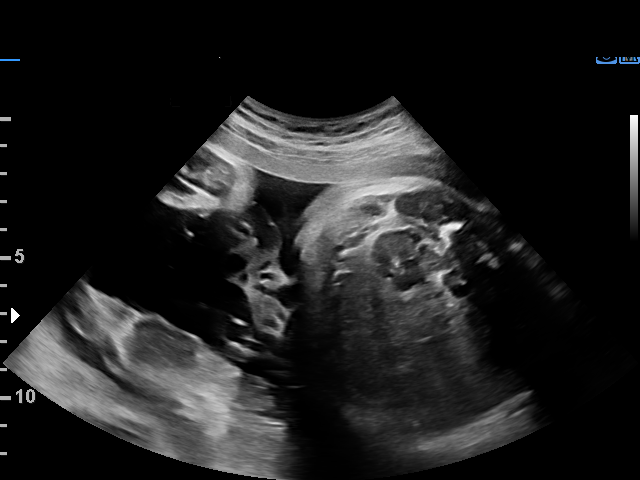
[im 30/43]
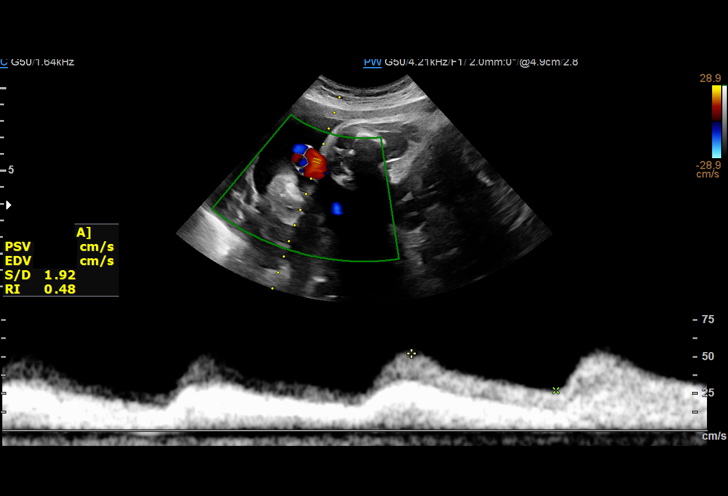
[im 33/43]
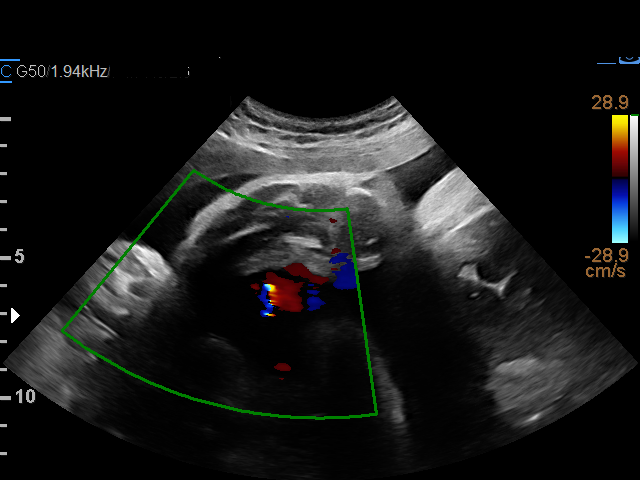
[im 36/43]
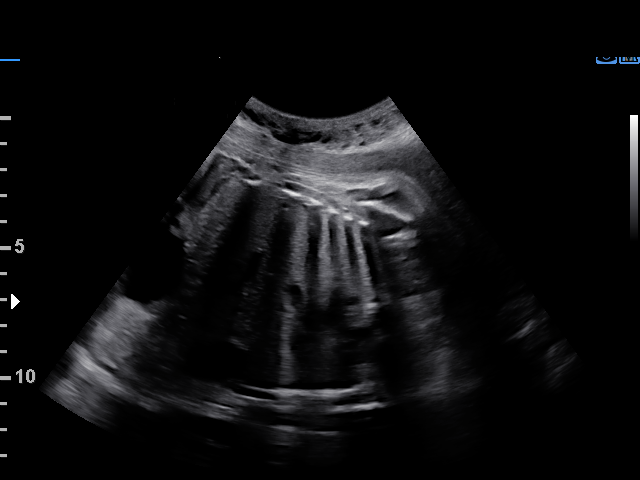
[im 39/43]
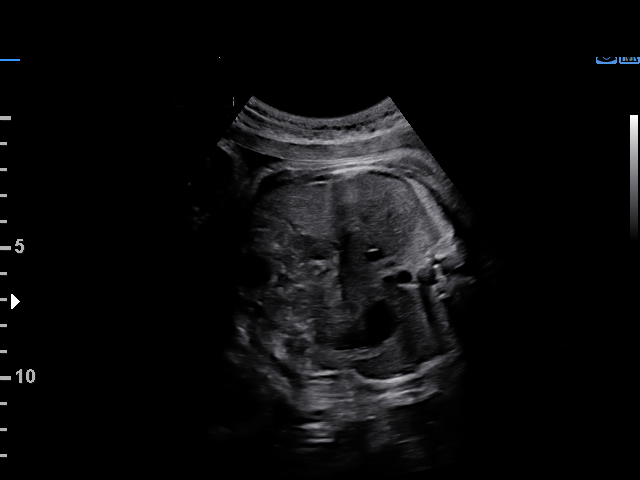
[im 43/43]
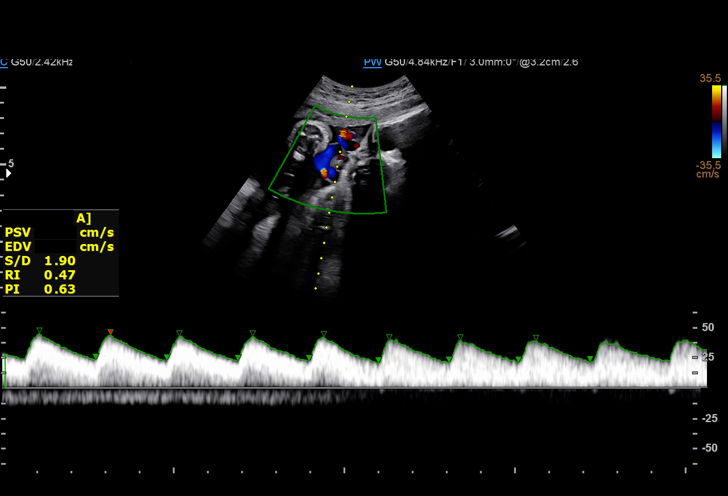

[14 of 28 positions shown; findings below may reference images not displayed]

[facility]
 ----------------------------------------------------------------------

 ----------------------------------------------------------------------
Indications

  Poor obstetric history: Previous fetal growth
  restriction (FGR)
  Encounter for other antenatal screening
  follow-up (low risk NIPS)
  Thyroid disease in pregnancy (thryomegaly)     O99.280,
  (nl TSH level [DATE])
  Abnormal biochemical screen (silent alpha
  Megumi Hutter, increased risk for SMA)
  37 weeks gestation of pregnancy
 ----------------------------------------------------------------------
Fetal Evaluation

 Num Of Fetuses:         1
 Fetal Heart Rate(bpm):  144
 Cardiac Activity:       Observed
 Presentation:           Cephalic
 Placenta:               Posterior
 P. Cord Insertion:      Visualized

 Amniotic Fluid
 AFI FV:      Within normal limits

 AFI Sum(cm)     %Tile       Largest Pocket(cm)
 13.81           51

 RUQ(cm)       RLQ(cm)       LUQ(cm)        LLQ(cm)

Biometry
 BPD:        85  mm     G. Age:  34w 2d          5  %    CI:        70.85   %    70 - 86
                                                         FL/HC:      21.5   %    20.8 -
 HC:      321.8  mm     G. Age:  36w 2d         12  %    HC/AC:      1.02        0.92 -
 AC:      314.2  mm     G. Age:  35w 2d         19  %    FL/BPD:     81.5   %    71 - 87
 FL:       69.3  mm     G. Age:  35w 4d         16  %    FL/AC:      22.1   %    20 - 24
 HUM:      62.8  mm     G. Age:  36w 3d         59  %

 Est. FW:    7997  gm    5 lb 14 oz      17  %
OB History

 Gravidity:    2         Term:   1        Prem:   0        SAB:   0
 TOP:          0       Ectopic:  0        Living: 1
Gestational Age

 LMP:           35w 1d        Date:  12/29/18                 EDD:   10/05/19
 U/S Today:     35w 3d                                        EDD:   10/03/19
 Best:          37w 0d     Det. By:  U/S  (05/09/19)          EDD:   09/22/19
Anatomy

 Cranium:               Appears normal         Aortic Arch:            Previously seen
 Cavum:                 Appears normal         Ductal Arch:            Previously seen
 Ventricles:            Previously seen        Diaphragm:              Appears normal
 Choroid Plexus:        Previously seen        Stomach:                Appears normal, left
                                                                       sided
 Cerebellum:            Previously seen        Abdomen:                Appears normal
 Posterior Fossa:       Previously seen        Abdominal Wall:         Previously seen
 Nuchal Fold:           Not applicable (>20    Cord Vessels:           Previously seen
                        wks GA)
 Face:                  Orbits and profile     Kidneys:                Appear normal
                        previously seen
 Lips:                  Previously seen        Bladder:                Appears normal
 Thoracic:              Appears normal         Spine:                  Previously seen
 Heart:                 Previously seen        Upper Extremities:      Previously seen
 RVOT:                  Previously seen        Lower Extremities:      Previously seen
 LVOT:                  Previously seen
Doppler - Fetal Vessels

 Umbilical Artery
  S/D     %tile                                            ADFV    RDFV
 2.09       32                                                No      No

Comments

 This patient was seen for a follow up growth scan due to a
 history of a prior IUGR baby.  She denies any problems since
 her last exam and reports feeling vigorous fetal movements
 throughout the day.
 She was informed that the fetal growth and amniotic fluid
 level appears appropriate for her gestational age.
 Fetal movements and fetal breathing movements were noted
 throughout today's exam.
 As the fetal growth is within normal limits, no further exams
 were scheduled in our office.
